# Patient Record
Sex: Female | Born: 1950 | Race: White | Hispanic: No | Marital: Married | Smoking: Never smoker
Health system: Southern US, Community
[De-identification: ages and names within clinical notes are randomized; demographics above are authoritative.]

## PROBLEM LIST (undated history)

## (undated) DIAGNOSIS — K219 Gastro-esophageal reflux disease without esophagitis: Secondary | ICD-10-CM

## (undated) DIAGNOSIS — F329 Major depressive disorder, single episode, unspecified: Secondary | ICD-10-CM

## (undated) DIAGNOSIS — M109 Gout, unspecified: Secondary | ICD-10-CM

## (undated) DIAGNOSIS — N289 Disorder of kidney and ureter, unspecified: Secondary | ICD-10-CM

## (undated) DIAGNOSIS — E119 Type 2 diabetes mellitus without complications: Secondary | ICD-10-CM

## (undated) DIAGNOSIS — I639 Cerebral infarction, unspecified: Secondary | ICD-10-CM

## (undated) DIAGNOSIS — Z8673 Personal history of transient ischemic attack (TIA), and cerebral infarction without residual deficits: Secondary | ICD-10-CM

## (undated) DIAGNOSIS — G2581 Restless legs syndrome: Secondary | ICD-10-CM

## (undated) DIAGNOSIS — K760 Fatty (change of) liver, not elsewhere classified: Secondary | ICD-10-CM

## (undated) DIAGNOSIS — I252 Old myocardial infarction: Secondary | ICD-10-CM

## (undated) DIAGNOSIS — K859 Acute pancreatitis without necrosis or infection, unspecified: Secondary | ICD-10-CM

## (undated) DIAGNOSIS — IMO0002 Reserved for concepts with insufficient information to code with codable children: Secondary | ICD-10-CM

## (undated) HISTORY — DX: Acute pancreatitis without necrosis or infection, unspecified: K85.90

## (undated) HISTORY — DX: Restless legs syndrome: G25.81

## (undated) HISTORY — DX: Type 2 diabetes mellitus without complications: E11.9

## (undated) HISTORY — DX: Gout, unspecified: M10.9

## (undated) HISTORY — DX: Fatty (change of) liver, not elsewhere classified: K76.0

## (undated) HISTORY — DX: Personal history of transient ischemic attack (TIA), and cerebral infarction without residual deficits: Z86.73

## (undated) HISTORY — DX: Cerebral infarction, unspecified: I63.9

## (undated) HISTORY — DX: Reserved for concepts with insufficient information to code with codable children: IMO0002

## (undated) HISTORY — DX: Old myocardial infarction: I25.2

## (undated) HISTORY — DX: Gastro-esophageal reflux disease without esophagitis: K21.9

## (undated) HISTORY — DX: Major depressive disorder, single episode, unspecified: F32.9

---

## 2016-07-30 DIAGNOSIS — E118 Type 2 diabetes mellitus with unspecified complications: Secondary | ICD-10-CM

## 2016-07-30 DIAGNOSIS — I959 Hypotension, unspecified: Secondary | ICD-10-CM

## 2016-07-30 DIAGNOSIS — N179 Acute kidney failure, unspecified: Secondary | ICD-10-CM

## 2016-07-30 DIAGNOSIS — K859 Acute pancreatitis without necrosis or infection, unspecified: Secondary | ICD-10-CM

## 2016-07-30 DIAGNOSIS — K219 Gastro-esophageal reflux disease without esophagitis: Secondary | ICD-10-CM

## 2016-07-30 DIAGNOSIS — G2581 Restless legs syndrome: Secondary | ICD-10-CM

## 2016-07-30 DIAGNOSIS — E781 Pure hyperglyceridemia: Secondary | ICD-10-CM

## 2016-07-30 DIAGNOSIS — E1142 Type 2 diabetes mellitus with diabetic polyneuropathy: Secondary | ICD-10-CM

## 2016-07-30 DIAGNOSIS — I10 Essential (primary) hypertension: Secondary | ICD-10-CM

## 2016-07-31 DIAGNOSIS — E118 Type 2 diabetes mellitus with unspecified complications: Secondary | ICD-10-CM | POA: Diagnosis not present

## 2016-07-31 DIAGNOSIS — I959 Hypotension, unspecified: Secondary | ICD-10-CM | POA: Diagnosis not present

## 2016-07-31 DIAGNOSIS — E1142 Type 2 diabetes mellitus with diabetic polyneuropathy: Secondary | ICD-10-CM | POA: Diagnosis not present

## 2016-07-31 DIAGNOSIS — I10 Essential (primary) hypertension: Secondary | ICD-10-CM | POA: Diagnosis not present

## 2016-08-01 DIAGNOSIS — I10 Essential (primary) hypertension: Secondary | ICD-10-CM | POA: Diagnosis not present

## 2016-08-01 DIAGNOSIS — E118 Type 2 diabetes mellitus with unspecified complications: Secondary | ICD-10-CM | POA: Diagnosis not present

## 2016-08-01 DIAGNOSIS — E1142 Type 2 diabetes mellitus with diabetic polyneuropathy: Secondary | ICD-10-CM | POA: Diagnosis not present

## 2016-08-01 DIAGNOSIS — I959 Hypotension, unspecified: Secondary | ICD-10-CM | POA: Diagnosis not present

## 2016-08-02 DIAGNOSIS — E1142 Type 2 diabetes mellitus with diabetic polyneuropathy: Secondary | ICD-10-CM | POA: Diagnosis not present

## 2016-08-02 DIAGNOSIS — I959 Hypotension, unspecified: Secondary | ICD-10-CM | POA: Diagnosis not present

## 2016-08-02 DIAGNOSIS — I10 Essential (primary) hypertension: Secondary | ICD-10-CM | POA: Diagnosis not present

## 2016-08-02 DIAGNOSIS — E118 Type 2 diabetes mellitus with unspecified complications: Secondary | ICD-10-CM | POA: Diagnosis not present

## 2016-09-13 DIAGNOSIS — K219 Gastro-esophageal reflux disease without esophagitis: Secondary | ICD-10-CM | POA: Diagnosis not present

## 2016-09-13 DIAGNOSIS — E118 Type 2 diabetes mellitus with unspecified complications: Secondary | ICD-10-CM | POA: Diagnosis not present

## 2016-09-13 DIAGNOSIS — K859 Acute pancreatitis without necrosis or infection, unspecified: Secondary | ICD-10-CM | POA: Diagnosis not present

## 2016-09-13 DIAGNOSIS — I10 Essential (primary) hypertension: Secondary | ICD-10-CM

## 2016-09-13 DIAGNOSIS — R112 Nausea with vomiting, unspecified: Secondary | ICD-10-CM

## 2016-09-14 DIAGNOSIS — K859 Acute pancreatitis without necrosis or infection, unspecified: Secondary | ICD-10-CM | POA: Diagnosis not present

## 2016-09-14 DIAGNOSIS — E1142 Type 2 diabetes mellitus with diabetic polyneuropathy: Secondary | ICD-10-CM

## 2016-09-14 DIAGNOSIS — I10 Essential (primary) hypertension: Secondary | ICD-10-CM

## 2016-09-14 DIAGNOSIS — E118 Type 2 diabetes mellitus with unspecified complications: Secondary | ICD-10-CM | POA: Diagnosis not present

## 2016-09-14 DIAGNOSIS — E781 Pure hyperglyceridemia: Secondary | ICD-10-CM

## 2016-09-15 DIAGNOSIS — K859 Acute pancreatitis without necrosis or infection, unspecified: Secondary | ICD-10-CM | POA: Diagnosis not present

## 2016-09-15 DIAGNOSIS — I10 Essential (primary) hypertension: Secondary | ICD-10-CM | POA: Diagnosis not present

## 2016-09-15 DIAGNOSIS — E118 Type 2 diabetes mellitus with unspecified complications: Secondary | ICD-10-CM | POA: Diagnosis not present

## 2016-09-15 DIAGNOSIS — E1142 Type 2 diabetes mellitus with diabetic polyneuropathy: Secondary | ICD-10-CM | POA: Diagnosis not present

## 2017-02-28 DIAGNOSIS — E1142 Type 2 diabetes mellitus with diabetic polyneuropathy: Secondary | ICD-10-CM | POA: Diagnosis not present

## 2017-02-28 DIAGNOSIS — E118 Type 2 diabetes mellitus with unspecified complications: Secondary | ICD-10-CM | POA: Diagnosis not present

## 2017-02-28 DIAGNOSIS — R112 Nausea with vomiting, unspecified: Secondary | ICD-10-CM

## 2017-02-28 DIAGNOSIS — F329 Major depressive disorder, single episode, unspecified: Secondary | ICD-10-CM | POA: Diagnosis not present

## 2017-02-28 DIAGNOSIS — K859 Acute pancreatitis without necrosis or infection, unspecified: Secondary | ICD-10-CM | POA: Diagnosis not present

## 2017-02-28 DIAGNOSIS — I160 Hypertensive urgency: Secondary | ICD-10-CM | POA: Diagnosis not present

## 2017-02-28 DIAGNOSIS — Z8719 Personal history of other diseases of the digestive system: Secondary | ICD-10-CM

## 2017-02-28 DIAGNOSIS — G2581 Restless legs syndrome: Secondary | ICD-10-CM | POA: Diagnosis not present

## 2017-02-28 DIAGNOSIS — E781 Pure hyperglyceridemia: Secondary | ICD-10-CM | POA: Diagnosis not present

## 2017-02-28 DIAGNOSIS — K219 Gastro-esophageal reflux disease without esophagitis: Secondary | ICD-10-CM | POA: Diagnosis not present

## 2017-03-01 DIAGNOSIS — E118 Type 2 diabetes mellitus with unspecified complications: Secondary | ICD-10-CM | POA: Diagnosis not present

## 2017-03-01 DIAGNOSIS — K859 Acute pancreatitis without necrosis or infection, unspecified: Secondary | ICD-10-CM | POA: Diagnosis not present

## 2017-03-01 DIAGNOSIS — E781 Pure hyperglyceridemia: Secondary | ICD-10-CM | POA: Diagnosis not present

## 2017-03-02 DIAGNOSIS — K859 Acute pancreatitis without necrosis or infection, unspecified: Secondary | ICD-10-CM | POA: Diagnosis not present

## 2017-03-02 DIAGNOSIS — E118 Type 2 diabetes mellitus with unspecified complications: Secondary | ICD-10-CM | POA: Diagnosis not present

## 2017-03-02 DIAGNOSIS — I160 Hypertensive urgency: Secondary | ICD-10-CM | POA: Diagnosis not present

## 2017-03-02 DIAGNOSIS — E781 Pure hyperglyceridemia: Secondary | ICD-10-CM | POA: Diagnosis not present

## 2017-03-03 DIAGNOSIS — K859 Acute pancreatitis without necrosis or infection, unspecified: Secondary | ICD-10-CM | POA: Diagnosis not present

## 2017-03-03 DIAGNOSIS — E781 Pure hyperglyceridemia: Secondary | ICD-10-CM | POA: Diagnosis not present

## 2017-03-03 DIAGNOSIS — I160 Hypertensive urgency: Secondary | ICD-10-CM | POA: Diagnosis not present

## 2017-03-03 DIAGNOSIS — E118 Type 2 diabetes mellitus with unspecified complications: Secondary | ICD-10-CM | POA: Diagnosis not present

## 2017-03-04 DIAGNOSIS — E118 Type 2 diabetes mellitus with unspecified complications: Secondary | ICD-10-CM | POA: Diagnosis not present

## 2017-03-04 DIAGNOSIS — K859 Acute pancreatitis without necrosis or infection, unspecified: Secondary | ICD-10-CM | POA: Diagnosis not present

## 2017-03-04 DIAGNOSIS — I160 Hypertensive urgency: Secondary | ICD-10-CM | POA: Diagnosis not present

## 2017-03-04 DIAGNOSIS — E781 Pure hyperglyceridemia: Secondary | ICD-10-CM | POA: Diagnosis not present

## 2017-03-05 DIAGNOSIS — K859 Acute pancreatitis without necrosis or infection, unspecified: Secondary | ICD-10-CM | POA: Diagnosis not present

## 2017-03-05 DIAGNOSIS — E781 Pure hyperglyceridemia: Secondary | ICD-10-CM | POA: Diagnosis not present

## 2017-03-05 DIAGNOSIS — E118 Type 2 diabetes mellitus with unspecified complications: Secondary | ICD-10-CM | POA: Diagnosis not present

## 2017-03-05 DIAGNOSIS — I160 Hypertensive urgency: Secondary | ICD-10-CM | POA: Diagnosis not present

## 2017-05-15 DIAGNOSIS — R079 Chest pain, unspecified: Secondary | ICD-10-CM | POA: Diagnosis not present

## 2017-06-25 DIAGNOSIS — E781 Pure hyperglyceridemia: Secondary | ICD-10-CM

## 2017-06-25 DIAGNOSIS — I10 Essential (primary) hypertension: Secondary | ICD-10-CM | POA: Diagnosis not present

## 2017-06-25 DIAGNOSIS — F329 Major depressive disorder, single episode, unspecified: Secondary | ICD-10-CM | POA: Diagnosis not present

## 2017-06-25 DIAGNOSIS — E1142 Type 2 diabetes mellitus with diabetic polyneuropathy: Secondary | ICD-10-CM

## 2017-06-25 DIAGNOSIS — R112 Nausea with vomiting, unspecified: Secondary | ICD-10-CM | POA: Diagnosis not present

## 2017-06-25 DIAGNOSIS — K219 Gastro-esophageal reflux disease without esophagitis: Secondary | ICD-10-CM | POA: Diagnosis not present

## 2017-06-25 DIAGNOSIS — K859 Acute pancreatitis without necrosis or infection, unspecified: Secondary | ICD-10-CM

## 2017-06-25 DIAGNOSIS — E118 Type 2 diabetes mellitus with unspecified complications: Secondary | ICD-10-CM

## 2017-06-26 DIAGNOSIS — K219 Gastro-esophageal reflux disease without esophagitis: Secondary | ICD-10-CM | POA: Diagnosis not present

## 2017-06-26 DIAGNOSIS — E1142 Type 2 diabetes mellitus with diabetic polyneuropathy: Secondary | ICD-10-CM | POA: Diagnosis not present

## 2017-06-26 DIAGNOSIS — F329 Major depressive disorder, single episode, unspecified: Secondary | ICD-10-CM | POA: Diagnosis not present

## 2017-06-26 DIAGNOSIS — R112 Nausea with vomiting, unspecified: Secondary | ICD-10-CM | POA: Diagnosis not present

## 2017-06-26 DIAGNOSIS — K859 Acute pancreatitis without necrosis or infection, unspecified: Secondary | ICD-10-CM | POA: Diagnosis not present

## 2017-06-26 DIAGNOSIS — E781 Pure hyperglyceridemia: Secondary | ICD-10-CM | POA: Diagnosis not present

## 2017-06-26 DIAGNOSIS — I10 Essential (primary) hypertension: Secondary | ICD-10-CM | POA: Diagnosis not present

## 2017-06-26 DIAGNOSIS — E118 Type 2 diabetes mellitus with unspecified complications: Secondary | ICD-10-CM | POA: Diagnosis not present

## 2017-06-27 DIAGNOSIS — F329 Major depressive disorder, single episode, unspecified: Secondary | ICD-10-CM | POA: Diagnosis not present

## 2017-06-27 DIAGNOSIS — R112 Nausea with vomiting, unspecified: Secondary | ICD-10-CM | POA: Diagnosis not present

## 2017-06-27 DIAGNOSIS — K859 Acute pancreatitis without necrosis or infection, unspecified: Secondary | ICD-10-CM | POA: Diagnosis not present

## 2017-06-27 DIAGNOSIS — I10 Essential (primary) hypertension: Secondary | ICD-10-CM | POA: Diagnosis not present

## 2017-06-27 DIAGNOSIS — E118 Type 2 diabetes mellitus with unspecified complications: Secondary | ICD-10-CM | POA: Diagnosis not present

## 2017-06-27 DIAGNOSIS — K219 Gastro-esophageal reflux disease without esophagitis: Secondary | ICD-10-CM | POA: Diagnosis not present

## 2017-06-27 DIAGNOSIS — E1142 Type 2 diabetes mellitus with diabetic polyneuropathy: Secondary | ICD-10-CM | POA: Diagnosis not present

## 2017-06-27 DIAGNOSIS — E781 Pure hyperglyceridemia: Secondary | ICD-10-CM | POA: Diagnosis not present

## 2017-06-28 DIAGNOSIS — K219 Gastro-esophageal reflux disease without esophagitis: Secondary | ICD-10-CM | POA: Diagnosis not present

## 2017-06-28 DIAGNOSIS — K859 Acute pancreatitis without necrosis or infection, unspecified: Secondary | ICD-10-CM | POA: Diagnosis not present

## 2017-06-28 DIAGNOSIS — N179 Acute kidney failure, unspecified: Secondary | ICD-10-CM

## 2017-06-28 DIAGNOSIS — R109 Unspecified abdominal pain: Secondary | ICD-10-CM

## 2017-06-28 DIAGNOSIS — E118 Type 2 diabetes mellitus with unspecified complications: Secondary | ICD-10-CM | POA: Diagnosis not present

## 2017-06-29 DIAGNOSIS — N179 Acute kidney failure, unspecified: Secondary | ICD-10-CM | POA: Diagnosis not present

## 2017-06-29 DIAGNOSIS — K219 Gastro-esophageal reflux disease without esophagitis: Secondary | ICD-10-CM | POA: Diagnosis not present

## 2017-06-29 DIAGNOSIS — R109 Unspecified abdominal pain: Secondary | ICD-10-CM | POA: Diagnosis not present

## 2017-06-29 DIAGNOSIS — K859 Acute pancreatitis without necrosis or infection, unspecified: Secondary | ICD-10-CM | POA: Diagnosis not present

## 2017-06-29 DIAGNOSIS — E118 Type 2 diabetes mellitus with unspecified complications: Secondary | ICD-10-CM | POA: Diagnosis not present

## 2017-06-30 DIAGNOSIS — N179 Acute kidney failure, unspecified: Secondary | ICD-10-CM | POA: Diagnosis not present

## 2017-06-30 DIAGNOSIS — K219 Gastro-esophageal reflux disease without esophagitis: Secondary | ICD-10-CM | POA: Diagnosis not present

## 2017-06-30 DIAGNOSIS — K859 Acute pancreatitis without necrosis or infection, unspecified: Secondary | ICD-10-CM | POA: Diagnosis not present

## 2017-06-30 DIAGNOSIS — E118 Type 2 diabetes mellitus with unspecified complications: Secondary | ICD-10-CM | POA: Diagnosis not present

## 2017-06-30 DIAGNOSIS — R109 Unspecified abdominal pain: Secondary | ICD-10-CM | POA: Diagnosis not present

## 2017-07-01 DIAGNOSIS — K219 Gastro-esophageal reflux disease without esophagitis: Secondary | ICD-10-CM | POA: Diagnosis not present

## 2017-07-01 DIAGNOSIS — K859 Acute pancreatitis without necrosis or infection, unspecified: Secondary | ICD-10-CM | POA: Diagnosis not present

## 2017-07-01 DIAGNOSIS — R109 Unspecified abdominal pain: Secondary | ICD-10-CM | POA: Diagnosis not present

## 2017-07-01 DIAGNOSIS — E118 Type 2 diabetes mellitus with unspecified complications: Secondary | ICD-10-CM | POA: Diagnosis not present

## 2017-07-01 DIAGNOSIS — N179 Acute kidney failure, unspecified: Secondary | ICD-10-CM | POA: Diagnosis not present

## 2017-08-25 HISTORY — PX: CHOLECYSTECTOMY: SHX55

## 2017-09-29 DIAGNOSIS — I639 Cerebral infarction, unspecified: Secondary | ICD-10-CM | POA: Diagnosis not present

## 2017-09-30 DIAGNOSIS — I639 Cerebral infarction, unspecified: Secondary | ICD-10-CM | POA: Diagnosis not present

## 2018-04-21 DIAGNOSIS — E781 Pure hyperglyceridemia: Secondary | ICD-10-CM

## 2018-04-21 DIAGNOSIS — R079 Chest pain, unspecified: Secondary | ICD-10-CM | POA: Diagnosis not present

## 2018-04-21 DIAGNOSIS — I160 Hypertensive urgency: Secondary | ICD-10-CM | POA: Diagnosis not present

## 2018-04-21 DIAGNOSIS — M109 Gout, unspecified: Secondary | ICD-10-CM

## 2018-04-21 DIAGNOSIS — K859 Acute pancreatitis without necrosis or infection, unspecified: Secondary | ICD-10-CM | POA: Diagnosis not present

## 2018-04-21 DIAGNOSIS — G8929 Other chronic pain: Secondary | ICD-10-CM

## 2018-04-21 DIAGNOSIS — G2581 Restless legs syndrome: Secondary | ICD-10-CM

## 2018-04-21 DIAGNOSIS — E1110 Type 2 diabetes mellitus with ketoacidosis without coma: Secondary | ICD-10-CM | POA: Diagnosis not present

## 2018-04-21 DIAGNOSIS — K219 Gastro-esophageal reflux disease without esophagitis: Secondary | ICD-10-CM

## 2018-04-21 DIAGNOSIS — E1142 Type 2 diabetes mellitus with diabetic polyneuropathy: Secondary | ICD-10-CM

## 2018-04-21 DIAGNOSIS — R112 Nausea with vomiting, unspecified: Secondary | ICD-10-CM

## 2018-04-22 DIAGNOSIS — E1110 Type 2 diabetes mellitus with ketoacidosis without coma: Secondary | ICD-10-CM | POA: Diagnosis not present

## 2018-04-22 DIAGNOSIS — E1142 Type 2 diabetes mellitus with diabetic polyneuropathy: Secondary | ICD-10-CM | POA: Diagnosis not present

## 2018-04-22 DIAGNOSIS — K859 Acute pancreatitis without necrosis or infection, unspecified: Secondary | ICD-10-CM | POA: Diagnosis not present

## 2018-04-22 DIAGNOSIS — E781 Pure hyperglyceridemia: Secondary | ICD-10-CM | POA: Diagnosis not present

## 2018-04-23 DIAGNOSIS — E1110 Type 2 diabetes mellitus with ketoacidosis without coma: Secondary | ICD-10-CM | POA: Diagnosis not present

## 2018-04-23 DIAGNOSIS — E1142 Type 2 diabetes mellitus with diabetic polyneuropathy: Secondary | ICD-10-CM | POA: Diagnosis not present

## 2018-04-23 DIAGNOSIS — K859 Acute pancreatitis without necrosis or infection, unspecified: Secondary | ICD-10-CM | POA: Diagnosis not present

## 2018-04-23 DIAGNOSIS — E781 Pure hyperglyceridemia: Secondary | ICD-10-CM | POA: Diagnosis not present

## 2018-04-24 DIAGNOSIS — K859 Acute pancreatitis without necrosis or infection, unspecified: Secondary | ICD-10-CM | POA: Diagnosis not present

## 2018-04-24 DIAGNOSIS — E1142 Type 2 diabetes mellitus with diabetic polyneuropathy: Secondary | ICD-10-CM | POA: Diagnosis not present

## 2018-04-24 DIAGNOSIS — E781 Pure hyperglyceridemia: Secondary | ICD-10-CM | POA: Diagnosis not present

## 2018-04-24 DIAGNOSIS — E1110 Type 2 diabetes mellitus with ketoacidosis without coma: Secondary | ICD-10-CM | POA: Diagnosis not present

## 2018-04-25 DIAGNOSIS — E1110 Type 2 diabetes mellitus with ketoacidosis without coma: Secondary | ICD-10-CM | POA: Diagnosis not present

## 2018-04-25 DIAGNOSIS — M109 Gout, unspecified: Secondary | ICD-10-CM | POA: Diagnosis not present

## 2018-04-25 DIAGNOSIS — K859 Acute pancreatitis without necrosis or infection, unspecified: Secondary | ICD-10-CM | POA: Diagnosis not present

## 2018-04-25 DIAGNOSIS — E1142 Type 2 diabetes mellitus with diabetic polyneuropathy: Secondary | ICD-10-CM | POA: Diagnosis not present

## 2018-04-26 DIAGNOSIS — E1142 Type 2 diabetes mellitus with diabetic polyneuropathy: Secondary | ICD-10-CM | POA: Diagnosis not present

## 2018-04-26 DIAGNOSIS — M109 Gout, unspecified: Secondary | ICD-10-CM | POA: Diagnosis not present

## 2018-04-26 DIAGNOSIS — K859 Acute pancreatitis without necrosis or infection, unspecified: Secondary | ICD-10-CM | POA: Diagnosis not present

## 2018-04-26 DIAGNOSIS — E1110 Type 2 diabetes mellitus with ketoacidosis without coma: Secondary | ICD-10-CM | POA: Diagnosis not present

## 2018-08-23 ENCOUNTER — Other Ambulatory Visit: Payer: Self-pay | Admitting: Family Medicine

## 2018-08-23 DIAGNOSIS — N6489 Other specified disorders of breast: Secondary | ICD-10-CM

## 2018-08-23 DIAGNOSIS — N63 Unspecified lump in unspecified breast: Secondary | ICD-10-CM

## 2018-08-26 DIAGNOSIS — E1165 Type 2 diabetes mellitus with hyperglycemia: Secondary | ICD-10-CM

## 2018-08-26 DIAGNOSIS — K859 Acute pancreatitis without necrosis or infection, unspecified: Secondary | ICD-10-CM | POA: Diagnosis not present

## 2018-08-26 DIAGNOSIS — I10 Essential (primary) hypertension: Secondary | ICD-10-CM

## 2018-08-27 DIAGNOSIS — K859 Acute pancreatitis without necrosis or infection, unspecified: Secondary | ICD-10-CM | POA: Diagnosis not present

## 2018-08-27 DIAGNOSIS — I10 Essential (primary) hypertension: Secondary | ICD-10-CM | POA: Diagnosis not present

## 2018-08-27 DIAGNOSIS — E1165 Type 2 diabetes mellitus with hyperglycemia: Secondary | ICD-10-CM | POA: Diagnosis not present

## 2018-08-28 DIAGNOSIS — K859 Acute pancreatitis without necrosis or infection, unspecified: Secondary | ICD-10-CM | POA: Diagnosis not present

## 2018-08-28 DIAGNOSIS — E1165 Type 2 diabetes mellitus with hyperglycemia: Secondary | ICD-10-CM | POA: Diagnosis not present

## 2018-08-28 DIAGNOSIS — I10 Essential (primary) hypertension: Secondary | ICD-10-CM | POA: Diagnosis not present

## 2018-08-29 DIAGNOSIS — I10 Essential (primary) hypertension: Secondary | ICD-10-CM | POA: Diagnosis not present

## 2018-08-29 DIAGNOSIS — K859 Acute pancreatitis without necrosis or infection, unspecified: Secondary | ICD-10-CM | POA: Diagnosis not present

## 2018-08-29 DIAGNOSIS — E1165 Type 2 diabetes mellitus with hyperglycemia: Secondary | ICD-10-CM | POA: Diagnosis not present

## 2018-08-31 ENCOUNTER — Ambulatory Visit
Admission: RE | Admit: 2018-08-31 | Discharge: 2018-08-31 | Disposition: A | Discharge status: Home / Self Care | Payer: Medicare Other | Source: Ambulatory Visit | Attending: Family Medicine | Admitting: Family Medicine

## 2018-08-31 DIAGNOSIS — N6489 Other specified disorders of breast: Secondary | ICD-10-CM

## 2018-08-31 DIAGNOSIS — N63 Unspecified lump in unspecified breast: Secondary | ICD-10-CM

## 2018-10-07 DIAGNOSIS — G2581 Restless legs syndrome: Secondary | ICD-10-CM

## 2018-10-07 DIAGNOSIS — R109 Unspecified abdominal pain: Secondary | ICD-10-CM | POA: Diagnosis not present

## 2018-10-07 DIAGNOSIS — K859 Acute pancreatitis without necrosis or infection, unspecified: Secondary | ICD-10-CM

## 2018-10-07 DIAGNOSIS — I160 Hypertensive urgency: Secondary | ICD-10-CM | POA: Diagnosis not present

## 2018-10-07 DIAGNOSIS — E1165 Type 2 diabetes mellitus with hyperglycemia: Secondary | ICD-10-CM

## 2018-10-07 DIAGNOSIS — M109 Gout, unspecified: Secondary | ICD-10-CM

## 2018-10-07 DIAGNOSIS — K219 Gastro-esophageal reflux disease without esophagitis: Secondary | ICD-10-CM

## 2018-10-08 DIAGNOSIS — R109 Unspecified abdominal pain: Secondary | ICD-10-CM | POA: Diagnosis not present

## 2018-10-08 DIAGNOSIS — K859 Acute pancreatitis without necrosis or infection, unspecified: Secondary | ICD-10-CM | POA: Diagnosis not present

## 2018-10-08 DIAGNOSIS — I160 Hypertensive urgency: Secondary | ICD-10-CM | POA: Diagnosis not present

## 2018-10-08 DIAGNOSIS — E1165 Type 2 diabetes mellitus with hyperglycemia: Secondary | ICD-10-CM | POA: Diagnosis not present

## 2018-10-09 DIAGNOSIS — I160 Hypertensive urgency: Secondary | ICD-10-CM | POA: Diagnosis not present

## 2018-10-09 DIAGNOSIS — R109 Unspecified abdominal pain: Secondary | ICD-10-CM | POA: Diagnosis not present

## 2018-10-09 DIAGNOSIS — K859 Acute pancreatitis without necrosis or infection, unspecified: Secondary | ICD-10-CM | POA: Diagnosis not present

## 2018-10-09 DIAGNOSIS — E1165 Type 2 diabetes mellitus with hyperglycemia: Secondary | ICD-10-CM | POA: Diagnosis not present

## 2018-10-10 DIAGNOSIS — E1165 Type 2 diabetes mellitus with hyperglycemia: Secondary | ICD-10-CM | POA: Diagnosis not present

## 2018-10-10 DIAGNOSIS — R109 Unspecified abdominal pain: Secondary | ICD-10-CM | POA: Diagnosis not present

## 2018-10-10 DIAGNOSIS — K859 Acute pancreatitis without necrosis or infection, unspecified: Secondary | ICD-10-CM | POA: Diagnosis not present

## 2018-10-10 DIAGNOSIS — I160 Hypertensive urgency: Secondary | ICD-10-CM | POA: Diagnosis not present

## 2018-10-11 DIAGNOSIS — I160 Hypertensive urgency: Secondary | ICD-10-CM | POA: Diagnosis not present

## 2018-10-11 DIAGNOSIS — K859 Acute pancreatitis without necrosis or infection, unspecified: Secondary | ICD-10-CM | POA: Diagnosis not present

## 2018-10-11 DIAGNOSIS — R109 Unspecified abdominal pain: Secondary | ICD-10-CM | POA: Diagnosis not present

## 2018-10-11 DIAGNOSIS — E1165 Type 2 diabetes mellitus with hyperglycemia: Secondary | ICD-10-CM | POA: Diagnosis not present

## 2019-03-31 DIAGNOSIS — N390 Urinary tract infection, site not specified: Secondary | ICD-10-CM | POA: Diagnosis not present

## 2019-03-31 DIAGNOSIS — K859 Acute pancreatitis without necrosis or infection, unspecified: Secondary | ICD-10-CM | POA: Diagnosis not present

## 2019-03-31 DIAGNOSIS — E781 Pure hyperglyceridemia: Secondary | ICD-10-CM

## 2019-03-31 DIAGNOSIS — K219 Gastro-esophageal reflux disease without esophagitis: Secondary | ICD-10-CM

## 2019-03-31 DIAGNOSIS — K921 Melena: Secondary | ICD-10-CM | POA: Diagnosis not present

## 2019-03-31 DIAGNOSIS — K861 Other chronic pancreatitis: Secondary | ICD-10-CM

## 2019-03-31 DIAGNOSIS — B9620 Unspecified Escherichia coli [E. coli] as the cause of diseases classified elsewhere: Secondary | ICD-10-CM

## 2019-03-31 DIAGNOSIS — R1084 Generalized abdominal pain: Secondary | ICD-10-CM | POA: Diagnosis not present

## 2019-03-31 DIAGNOSIS — E1165 Type 2 diabetes mellitus with hyperglycemia: Secondary | ICD-10-CM

## 2019-03-31 DIAGNOSIS — K274 Chronic or unspecified peptic ulcer, site unspecified, with hemorrhage: Secondary | ICD-10-CM | POA: Diagnosis not present

## 2019-04-01 DIAGNOSIS — K274 Chronic or unspecified peptic ulcer, site unspecified, with hemorrhage: Secondary | ICD-10-CM | POA: Diagnosis not present

## 2019-04-01 DIAGNOSIS — K921 Melena: Secondary | ICD-10-CM | POA: Diagnosis not present

## 2019-04-01 DIAGNOSIS — N390 Urinary tract infection, site not specified: Secondary | ICD-10-CM | POA: Diagnosis not present

## 2019-04-01 DIAGNOSIS — K859 Acute pancreatitis without necrosis or infection, unspecified: Secondary | ICD-10-CM | POA: Diagnosis not present

## 2019-04-02 DIAGNOSIS — K921 Melena: Secondary | ICD-10-CM | POA: Diagnosis not present

## 2019-04-02 DIAGNOSIS — K274 Chronic or unspecified peptic ulcer, site unspecified, with hemorrhage: Secondary | ICD-10-CM | POA: Diagnosis not present

## 2019-04-02 DIAGNOSIS — N390 Urinary tract infection, site not specified: Secondary | ICD-10-CM | POA: Diagnosis not present

## 2019-04-02 DIAGNOSIS — K859 Acute pancreatitis without necrosis or infection, unspecified: Secondary | ICD-10-CM | POA: Diagnosis not present

## 2019-04-03 DIAGNOSIS — N390 Urinary tract infection, site not specified: Secondary | ICD-10-CM | POA: Diagnosis not present

## 2019-04-03 DIAGNOSIS — K859 Acute pancreatitis without necrosis or infection, unspecified: Secondary | ICD-10-CM | POA: Diagnosis not present

## 2019-04-03 DIAGNOSIS — K274 Chronic or unspecified peptic ulcer, site unspecified, with hemorrhage: Secondary | ICD-10-CM | POA: Diagnosis not present

## 2019-04-03 DIAGNOSIS — K921 Melena: Secondary | ICD-10-CM | POA: Diagnosis not present

## 2019-04-04 DIAGNOSIS — N390 Urinary tract infection, site not specified: Secondary | ICD-10-CM | POA: Diagnosis not present

## 2019-04-04 DIAGNOSIS — K921 Melena: Secondary | ICD-10-CM | POA: Diagnosis not present

## 2019-04-04 DIAGNOSIS — K859 Acute pancreatitis without necrosis or infection, unspecified: Secondary | ICD-10-CM | POA: Diagnosis not present

## 2019-04-04 DIAGNOSIS — K274 Chronic or unspecified peptic ulcer, site unspecified, with hemorrhage: Secondary | ICD-10-CM | POA: Diagnosis not present

## 2019-04-05 DIAGNOSIS — K274 Chronic or unspecified peptic ulcer, site unspecified, with hemorrhage: Secondary | ICD-10-CM | POA: Diagnosis not present

## 2019-04-05 DIAGNOSIS — K921 Melena: Secondary | ICD-10-CM | POA: Diagnosis not present

## 2019-04-05 DIAGNOSIS — N390 Urinary tract infection, site not specified: Secondary | ICD-10-CM | POA: Diagnosis not present

## 2019-04-05 DIAGNOSIS — K859 Acute pancreatitis without necrosis or infection, unspecified: Secondary | ICD-10-CM | POA: Diagnosis not present

## 2019-04-10 IMAGING — US US BREAST BX W LOC DEV 1ST LESION IMG BX SPEC US GUIDE*L*
1 series · 8 of 8 positions shown · non-contrast
Comparison: Previous exam(s).

Addendum:
CLINICAL DATA: 67-year-old female presenting for ultrasound-guided
biopsy of a left breast mass.

EXAM:
ULTRASOUND GUIDED LEFT BREAST CORE NEEDLE BIOPSY

[Series 1: us breast bx w loc dev 1st lesion img bx spec us g · 0.05mm/px · 8 of 8 slices shown]
[im 1/8]
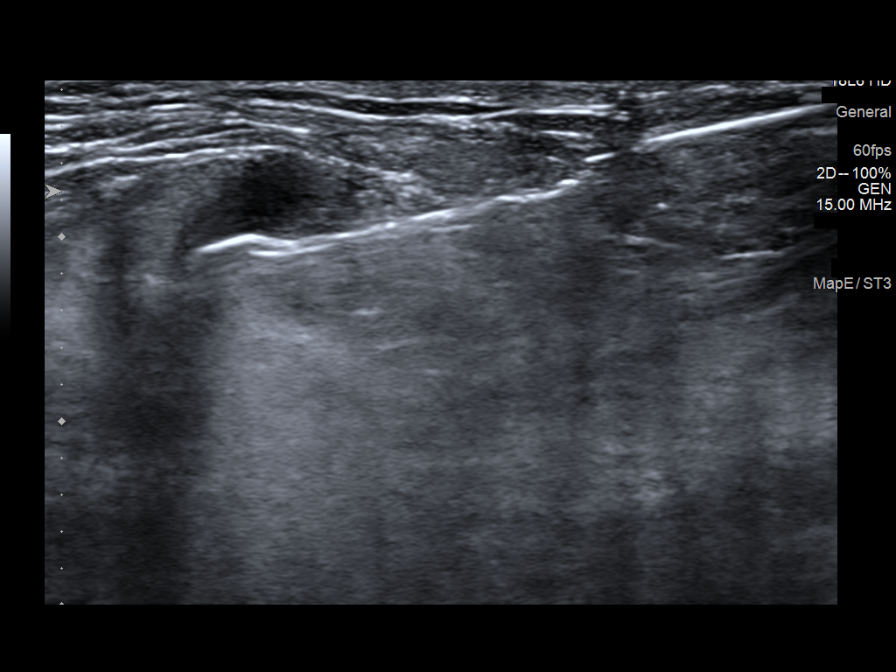
[im 2/8]
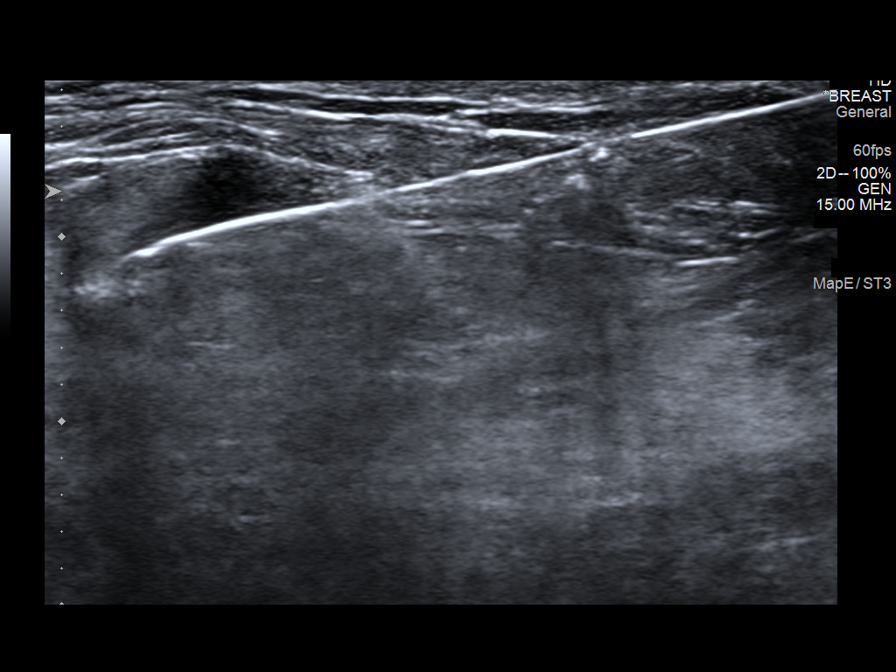
[im 3/8]
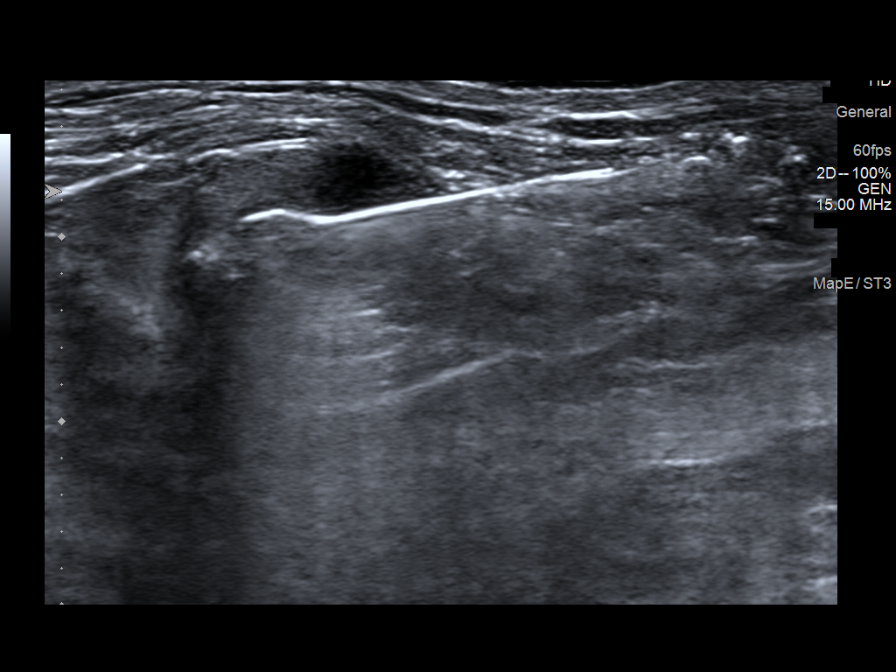
[im 4/8]
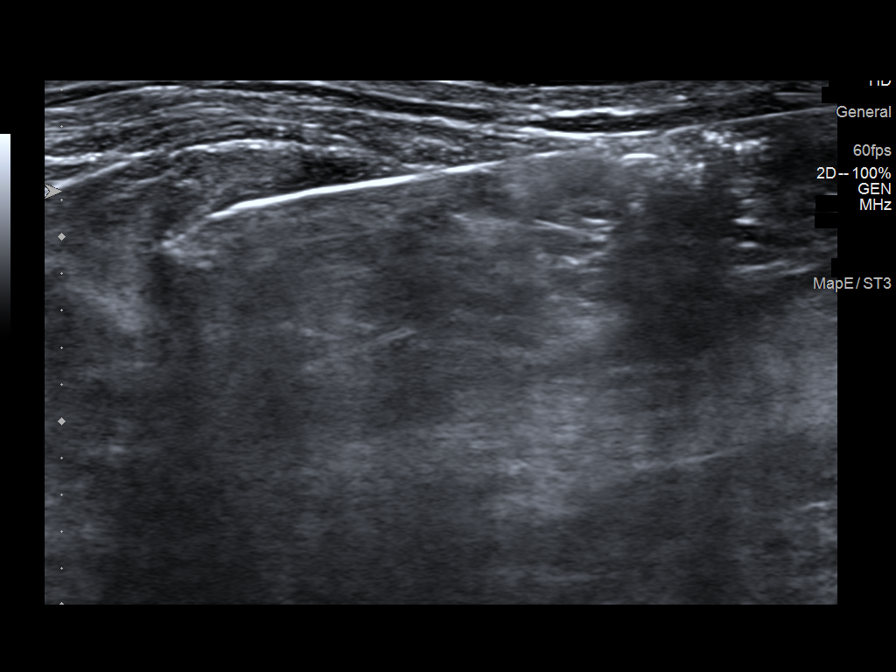
[im 5/8]
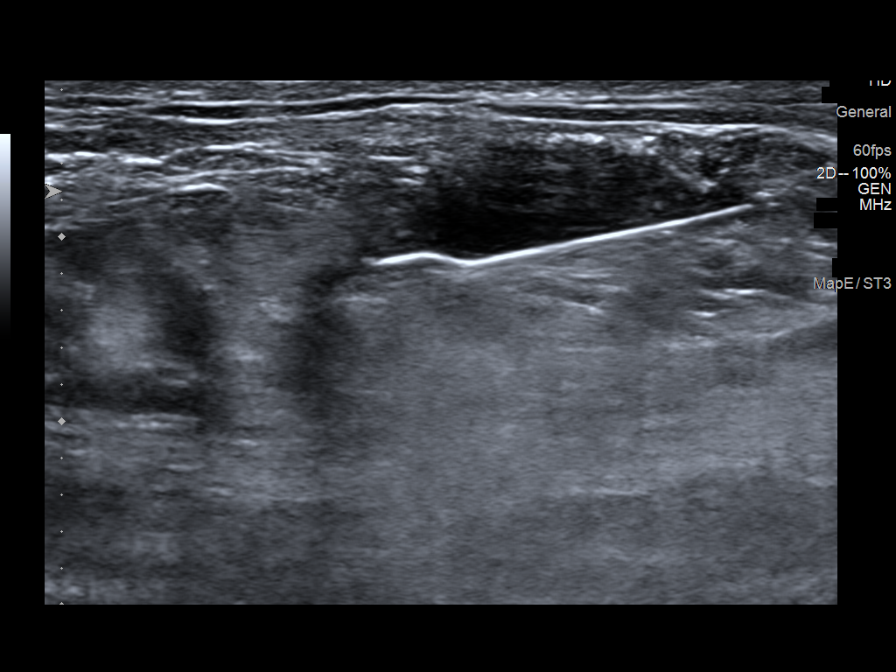
[im 6/8]
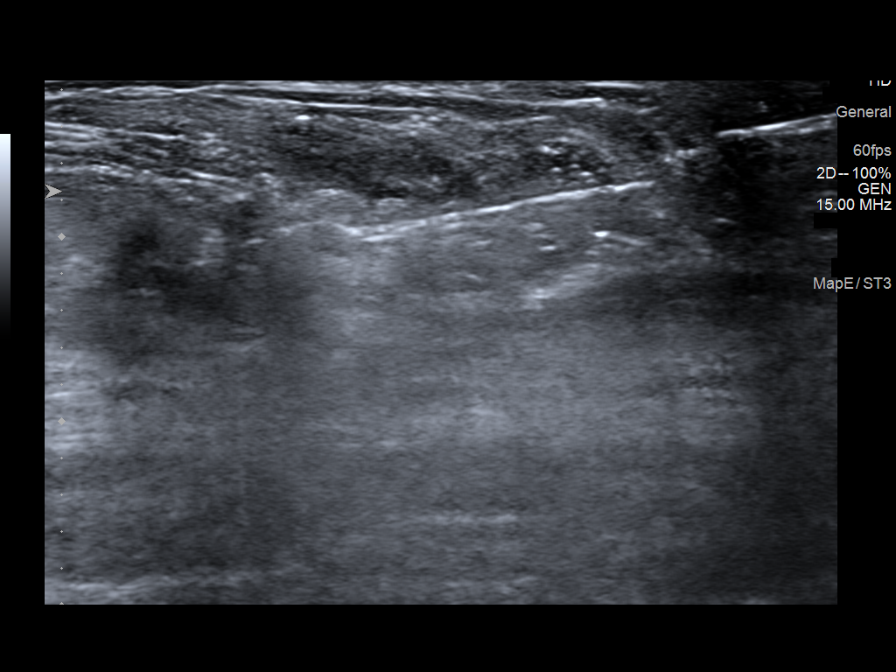
[im 7/8]
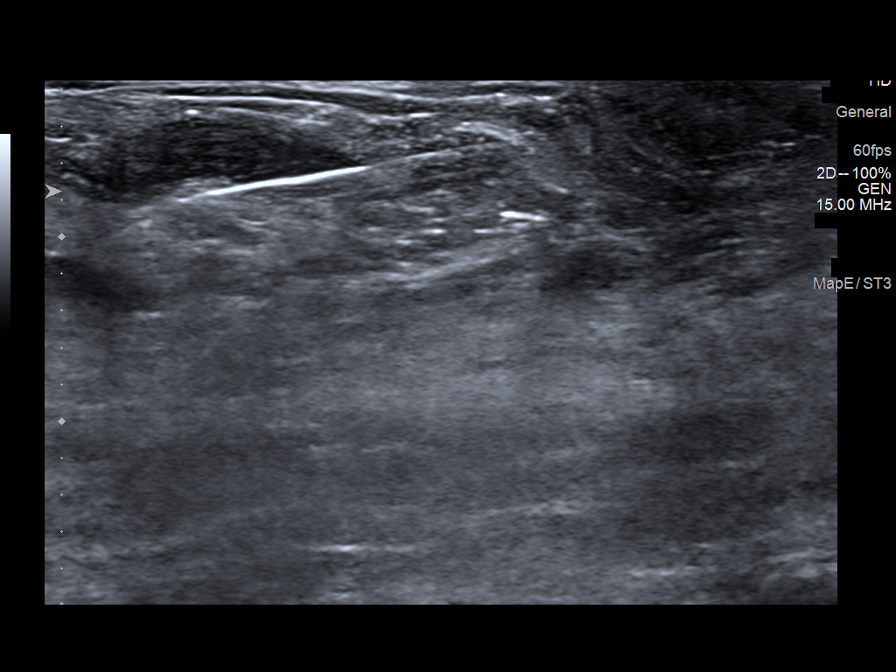
[im 8/8]
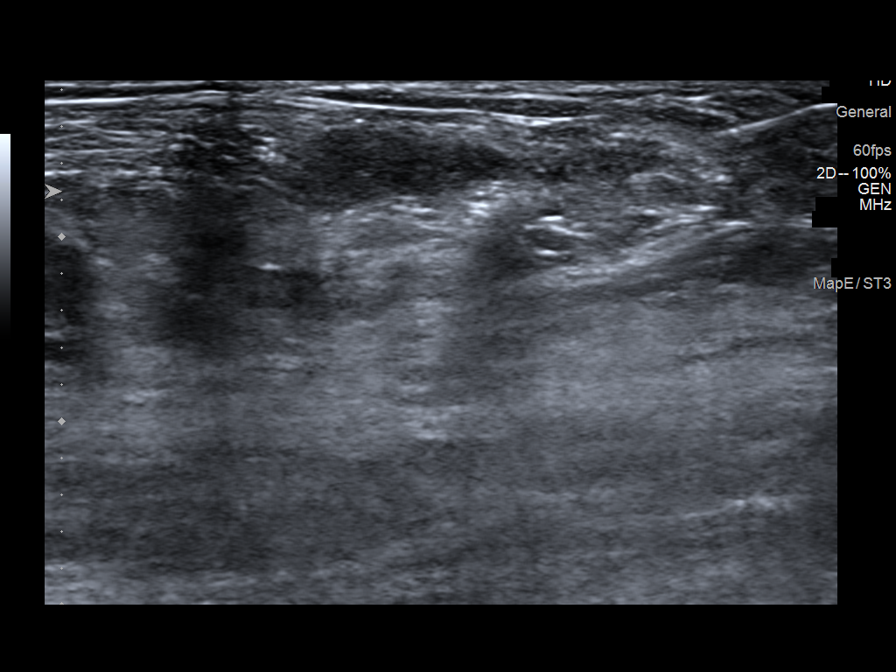

[8 of 8 positions shown; findings below may reference images not displayed]



Lesion quadrant: Upper-outer quadrant

Using sterile technique and 1% Lidocaine as local anesthetic, under
direct ultrasound visualization, a 14 gauge Morebodi device was
used to perform biopsy of a mass in the left breast at 1 o'clock
using an inferior approach. At the conclusion of the procedure a
ribbon shaped tissue marker clip was deployed into the biopsy
cavity. The patient did develop a small hematoma at the biopsy site.
Bleeding was controlled with manual pressure and stopped prior to
bandaging.

Follow up 2 view mammogram was performed and dictated separately.
IMPRESSION: 1. Ultrasound guided biopsy of left breast mass at 1 o'clock. The
patient developed a small hematoma at the biopsy site. Bleeding was
controlled before the patient was released.

2. A stereotactic biopsy was scheduled to be performed following
this ultrasound biopsy. Stereotactic biopsy was canceled as the area
of concern did not persist. This will be described in further detail
in an addendum to the original diagnostic mammogram report from
08/23/2018.

ADDENDUM:
Pathology revealed FIBROCYSTIC CHANGES WITH APOCRINE METAPLASIA of
the Left breast, 1 o'clock. This was found to be concordant by Dr.
Yau Kam Saludares.

Pathology results were discussed with the patient by telephone. The
patient reported doing well after the biopsy with tenderness at the
site. Post biopsy instructions and care were reviewed and questions
were answered. The patient was encouraged to call The [REDACTED]

The patient was asked to return for left diagnostic mammography and
possible ultrasound in 6 months to monitor an area of questioned
distortion in the upper outer left breast that did not persist at
the time of biopsy. The patient was informed a reminder notice would
be sent regarding this appointment.

Pathology results reported by Elouali Hasa, RN on 09/03/2018.

*** End of Addendum ***

## 2019-09-01 DIAGNOSIS — E781 Pure hyperglyceridemia: Secondary | ICD-10-CM | POA: Diagnosis not present

## 2019-09-01 DIAGNOSIS — K861 Other chronic pancreatitis: Secondary | ICD-10-CM

## 2019-09-01 DIAGNOSIS — N179 Acute kidney failure, unspecified: Secondary | ICD-10-CM | POA: Diagnosis not present

## 2019-09-01 DIAGNOSIS — E119 Type 2 diabetes mellitus without complications: Secondary | ICD-10-CM | POA: Diagnosis not present

## 2019-09-02 DIAGNOSIS — E781 Pure hyperglyceridemia: Secondary | ICD-10-CM | POA: Diagnosis not present

## 2019-09-02 DIAGNOSIS — N179 Acute kidney failure, unspecified: Secondary | ICD-10-CM | POA: Diagnosis not present

## 2019-09-02 DIAGNOSIS — E119 Type 2 diabetes mellitus without complications: Secondary | ICD-10-CM | POA: Diagnosis not present

## 2019-09-02 DIAGNOSIS — K861 Other chronic pancreatitis: Secondary | ICD-10-CM | POA: Diagnosis not present

## 2019-09-03 DIAGNOSIS — K861 Other chronic pancreatitis: Secondary | ICD-10-CM | POA: Diagnosis not present

## 2019-09-03 DIAGNOSIS — E781 Pure hyperglyceridemia: Secondary | ICD-10-CM | POA: Diagnosis not present

## 2019-09-03 DIAGNOSIS — E119 Type 2 diabetes mellitus without complications: Secondary | ICD-10-CM | POA: Diagnosis not present

## 2019-09-03 DIAGNOSIS — N179 Acute kidney failure, unspecified: Secondary | ICD-10-CM | POA: Diagnosis not present

## 2019-12-04 DIAGNOSIS — I340 Nonrheumatic mitral (valve) insufficiency: Secondary | ICD-10-CM | POA: Diagnosis not present

## 2019-12-11 ENCOUNTER — Ambulatory Visit: Payer: Medicare Other | Admitting: Gastroenterology

## 2020-01-13 ENCOUNTER — Encounter: Payer: Self-pay | Admitting: Family Medicine

## 2020-01-21 DIAGNOSIS — E119 Type 2 diabetes mellitus without complications: Secondary | ICD-10-CM | POA: Diagnosis not present

## 2020-01-21 DIAGNOSIS — R109 Unspecified abdominal pain: Secondary | ICD-10-CM

## 2020-01-22 DIAGNOSIS — E119 Type 2 diabetes mellitus without complications: Secondary | ICD-10-CM | POA: Diagnosis not present

## 2020-01-22 DIAGNOSIS — R109 Unspecified abdominal pain: Secondary | ICD-10-CM | POA: Diagnosis not present

## 2020-01-23 DIAGNOSIS — R109 Unspecified abdominal pain: Secondary | ICD-10-CM | POA: Diagnosis not present

## 2020-01-23 DIAGNOSIS — E119 Type 2 diabetes mellitus without complications: Secondary | ICD-10-CM | POA: Diagnosis not present

## 2020-01-24 DIAGNOSIS — E119 Type 2 diabetes mellitus without complications: Secondary | ICD-10-CM | POA: Diagnosis not present

## 2020-01-24 DIAGNOSIS — R109 Unspecified abdominal pain: Secondary | ICD-10-CM | POA: Diagnosis not present

## 2020-01-25 DIAGNOSIS — R109 Unspecified abdominal pain: Secondary | ICD-10-CM | POA: Diagnosis not present

## 2020-01-25 DIAGNOSIS — E119 Type 2 diabetes mellitus without complications: Secondary | ICD-10-CM | POA: Diagnosis not present

## 2020-01-26 DIAGNOSIS — E119 Type 2 diabetes mellitus without complications: Secondary | ICD-10-CM | POA: Diagnosis not present

## 2020-01-26 DIAGNOSIS — R109 Unspecified abdominal pain: Secondary | ICD-10-CM | POA: Diagnosis not present

## 2020-04-10 DIAGNOSIS — J1282 Pneumonia due to coronavirus disease 2019: Secondary | ICD-10-CM

## 2021-10-15 DIAGNOSIS — J90 Pleural effusion, not elsewhere classified: Secondary | ICD-10-CM | POA: Diagnosis not present

## 2021-10-15 DIAGNOSIS — I340 Nonrheumatic mitral (valve) insufficiency: Secondary | ICD-10-CM | POA: Diagnosis not present

## 2022-12-17 ENCOUNTER — Encounter (HOSPITAL_COMMUNITY): Payer: Self-pay

## 2022-12-17 ENCOUNTER — Emergency Department (HOSPITAL_COMMUNITY): Payer: Medicare Other

## 2022-12-17 ENCOUNTER — Other Ambulatory Visit: Payer: Self-pay

## 2022-12-17 ENCOUNTER — Inpatient Hospital Stay (HOSPITAL_COMMUNITY)
Admission: EM | Admit: 2022-12-17 | Discharge: 2022-12-19 | DRG: 070 | Disposition: A | Discharge status: Home Health | Payer: Medicare Other | Attending: Infectious Diseases | Admitting: Infectious Diseases

## 2022-12-17 DIAGNOSIS — G2581 Restless legs syndrome: Secondary | ICD-10-CM | POA: Diagnosis present

## 2022-12-17 DIAGNOSIS — Z1152 Encounter for screening for COVID-19: Secondary | ICD-10-CM

## 2022-12-17 DIAGNOSIS — Z79899 Other long term (current) drug therapy: Secondary | ICD-10-CM

## 2022-12-17 DIAGNOSIS — T383X6A Underdosing of insulin and oral hypoglycemic [antidiabetic] drugs, initial encounter: Secondary | ICD-10-CM | POA: Diagnosis present

## 2022-12-17 DIAGNOSIS — R4781 Slurred speech: Secondary | ICD-10-CM | POA: Diagnosis present

## 2022-12-17 DIAGNOSIS — Z91148 Patient's other noncompliance with medication regimen for other reason: Secondary | ICD-10-CM

## 2022-12-17 DIAGNOSIS — G9340 Encephalopathy, unspecified: Secondary | ICD-10-CM | POA: Diagnosis not present

## 2022-12-17 DIAGNOSIS — M898X9 Other specified disorders of bone, unspecified site: Secondary | ICD-10-CM | POA: Diagnosis present

## 2022-12-17 DIAGNOSIS — Z9049 Acquired absence of other specified parts of digestive tract: Secondary | ICD-10-CM

## 2022-12-17 DIAGNOSIS — M109 Gout, unspecified: Secondary | ICD-10-CM | POA: Diagnosis present

## 2022-12-17 DIAGNOSIS — G939 Disorder of brain, unspecified: Secondary | ICD-10-CM

## 2022-12-17 DIAGNOSIS — G8929 Other chronic pain: Secondary | ICD-10-CM | POA: Diagnosis present

## 2022-12-17 DIAGNOSIS — N186 End stage renal disease: Secondary | ICD-10-CM | POA: Diagnosis present

## 2022-12-17 DIAGNOSIS — Z8673 Personal history of transient ischemic attack (TIA), and cerebral infarction without residual deficits: Secondary | ICD-10-CM

## 2022-12-17 DIAGNOSIS — I252 Old myocardial infarction: Secondary | ICD-10-CM

## 2022-12-17 DIAGNOSIS — N189 Chronic kidney disease, unspecified: Secondary | ICD-10-CM | POA: Diagnosis present

## 2022-12-17 DIAGNOSIS — K760 Fatty (change of) liver, not elsewhere classified: Secondary | ICD-10-CM | POA: Diagnosis present

## 2022-12-17 DIAGNOSIS — I2510 Atherosclerotic heart disease of native coronary artery without angina pectoris: Secondary | ICD-10-CM | POA: Diagnosis present

## 2022-12-17 DIAGNOSIS — R1032 Left lower quadrant pain: Secondary | ICD-10-CM | POA: Diagnosis present

## 2022-12-17 DIAGNOSIS — E1122 Type 2 diabetes mellitus with diabetic chronic kidney disease: Secondary | ICD-10-CM | POA: Diagnosis present

## 2022-12-17 DIAGNOSIS — I120 Hypertensive chronic kidney disease with stage 5 chronic kidney disease or end stage renal disease: Secondary | ICD-10-CM | POA: Diagnosis present

## 2022-12-17 DIAGNOSIS — Z992 Dependence on renal dialysis: Secondary | ICD-10-CM

## 2022-12-17 DIAGNOSIS — Z794 Long term (current) use of insulin: Secondary | ICD-10-CM

## 2022-12-17 DIAGNOSIS — G9341 Metabolic encephalopathy: Secondary | ICD-10-CM

## 2022-12-17 DIAGNOSIS — N2581 Secondary hyperparathyroidism of renal origin: Secondary | ICD-10-CM | POA: Diagnosis present

## 2022-12-17 DIAGNOSIS — E1165 Type 2 diabetes mellitus with hyperglycemia: Secondary | ICD-10-CM

## 2022-12-17 DIAGNOSIS — I10 Essential (primary) hypertension: Secondary | ICD-10-CM | POA: Diagnosis present

## 2022-12-17 DIAGNOSIS — R739 Hyperglycemia, unspecified: Secondary | ICD-10-CM

## 2022-12-17 DIAGNOSIS — D631 Anemia in chronic kidney disease: Secondary | ICD-10-CM | POA: Diagnosis present

## 2022-12-17 DIAGNOSIS — E1140 Type 2 diabetes mellitus with diabetic neuropathy, unspecified: Secondary | ICD-10-CM | POA: Diagnosis present

## 2022-12-17 DIAGNOSIS — J189 Pneumonia, unspecified organism: Secondary | ICD-10-CM | POA: Diagnosis present

## 2022-12-17 DIAGNOSIS — K219 Gastro-esophageal reflux disease without esophagitis: Secondary | ICD-10-CM | POA: Diagnosis present

## 2022-12-17 HISTORY — DX: Disorder of kidney and ureter, unspecified: N28.9

## 2022-12-17 LAB — GLUCOSE, CAPILLARY
Glucose-Capillary: 395 mg/dL — ABNORMAL HIGH (ref 70–99)
Glucose-Capillary: 133 mg/dL — ABNORMAL HIGH (ref 70–99)
Glucose-Capillary: 139 mg/dL — ABNORMAL HIGH (ref 70–99)

## 2022-12-17 LAB — APTT: aPTT: 29 seconds (ref 24–36)

## 2022-12-17 LAB — DIFFERENTIAL
Abs Immature Granulocytes: 0.02 10*3/uL (ref 0.00–0.07)
Basophils Absolute: 0 10*3/uL (ref 0.0–0.1)
Basophils Relative: 1 %
Eosinophils Absolute: 0.2 10*3/uL (ref 0.0–0.5)
Eosinophils Relative: 3 %
Immature Granulocytes: 0 %
Lymphocytes Relative: 17 %
Lymphs Abs: 1.1 10*3/uL (ref 0.7–4.0)
Monocytes Absolute: 0.5 10*3/uL (ref 0.1–1.0)
Monocytes Relative: 8 %
Neutro Abs: 4.3 10*3/uL (ref 1.7–7.7)
Neutrophils Relative %: 71 %

## 2022-12-17 LAB — RESP PANEL BY RT-PCR (RSV, FLU A&B, COVID)  RVPGX2
Influenza A by PCR: NEGATIVE
Influenza B by PCR: NEGATIVE
Resp Syncytial Virus by PCR: NEGATIVE
SARS Coronavirus 2 by RT PCR: NEGATIVE

## 2022-12-17 LAB — I-STAT VENOUS BLOOD GAS, ED
Acid-Base Excess: 0 mmol/L (ref 0.0–2.0)
Bicarbonate: 23.7 mmol/L (ref 20.0–28.0)
Calcium, Ion: 0.94 mmol/L — ABNORMAL LOW (ref 1.15–1.40)
HCT: 30 % — ABNORMAL LOW (ref 36.0–46.0)
Hemoglobin: 10.2 g/dL — ABNORMAL LOW (ref 12.0–15.0)
O2 Saturation: 87 %
Potassium: 3.8 mmol/L (ref 3.5–5.1)
Sodium: 131 mmol/L — ABNORMAL LOW (ref 135–145)
TCO2: 25 mmol/L (ref 22–32)
pCO2, Ven: 33.9 mmHg — ABNORMAL LOW (ref 44–60)
pH, Ven: 7.452 — ABNORMAL HIGH (ref 7.25–7.43)
pO2, Ven: 50 mmHg — ABNORMAL HIGH (ref 32–45)

## 2022-12-17 LAB — COMPREHENSIVE METABOLIC PANEL
ALT: 11 U/L (ref 0–44)
AST: 13 U/L — ABNORMAL LOW (ref 15–41)
Albumin: 2.9 g/dL — ABNORMAL LOW (ref 3.5–5.0)
Alkaline Phosphatase: 82 U/L (ref 38–126)
Anion gap: 16 — ABNORMAL HIGH (ref 5–15)
BUN: 17 mg/dL (ref 8–23)
CO2: 24 mmol/L (ref 22–32)
Calcium: 8 mg/dL — ABNORMAL LOW (ref 8.9–10.3)
Chloride: 92 mmol/L — ABNORMAL LOW (ref 98–111)
Creatinine, Ser: 3.01 mg/dL — ABNORMAL HIGH (ref 0.44–1.00)
GFR, Estimated: 16 mL/min — ABNORMAL LOW (ref >60)
Glucose, Bld: 509 mg/dL (ref 70–99)
Potassium: 3.4 mmol/L — ABNORMAL LOW (ref 3.5–5.1)
Sodium: 132 mmol/L — ABNORMAL LOW (ref 135–145)
Total Bilirubin: 0.8 mg/dL (ref 0.3–1.2)
Total Protein: 5.3 g/dL — ABNORMAL LOW (ref 6.5–8.1)

## 2022-12-17 LAB — AMMONIA: Ammonia: 26 umol/L (ref 9–35)

## 2022-12-17 LAB — BLOOD GAS, VENOUS
Acid-Base Excess: 2.9 mmol/L — ABNORMAL HIGH (ref 0.0–2.0)
Bicarbonate: 29.8 mmol/L — ABNORMAL HIGH (ref 20.0–28.0)
Drawn by: 1470
O2 Saturation: 63.1 %
Patient temperature: 35.7
pCO2, Ven: 51 mmHg (ref 44–60)
pH, Ven: 7.37 (ref 7.25–7.43)
pO2, Ven: 36 mmHg (ref 32–45)

## 2022-12-17 LAB — I-STAT CHEM 8, ED
BUN: 17 mg/dL (ref 8–23)
Calcium, Ion: 0.74 mmol/L — CL (ref 1.15–1.40)
Chloride: 98 mmol/L (ref 98–111)
Creatinine, Ser: 3.3 mg/dL — ABNORMAL HIGH (ref 0.44–1.00)
Glucose, Bld: 530 mg/dL (ref 70–99)
HCT: 28 % — ABNORMAL LOW (ref 36.0–46.0)
Hemoglobin: 9.5 g/dL — ABNORMAL LOW (ref 12.0–15.0)
Potassium: 3.4 mmol/L — ABNORMAL LOW (ref 3.5–5.1)
Sodium: 129 mmol/L — ABNORMAL LOW (ref 135–145)
TCO2: 23 mmol/L (ref 22–32)

## 2022-12-17 LAB — CBG MONITORING, ED
Glucose-Capillary: 521 mg/dL (ref 70–99)
Glucose-Capillary: 409 mg/dL — ABNORMAL HIGH (ref 70–99)

## 2022-12-17 LAB — CBC
HCT: 29.8 % — ABNORMAL LOW (ref 36.0–46.0)
Hemoglobin: 9.1 g/dL — ABNORMAL LOW (ref 12.0–15.0)
MCH: 28 pg (ref 26.0–34.0)
MCHC: 30.5 g/dL (ref 30.0–36.0)
MCV: 91.7 fL (ref 80.0–100.0)
Platelets: 207 10*3/uL (ref 150–400)
RBC: 3.25 MIL/uL — ABNORMAL LOW (ref 3.87–5.11)
RDW: 15.2 % (ref 11.5–15.5)
WBC: 6.1 10*3/uL (ref 4.0–10.5)
nRBC: 0 % (ref 0.0–0.2)

## 2022-12-17 LAB — PHOSPHORUS: Phosphorus: 5.3 mg/dL — ABNORMAL HIGH (ref 2.5–4.6)

## 2022-12-17 LAB — HEPATITIS B SURFACE ANTIGEN: Hepatitis B Surface Ag: NONREACTIVE

## 2022-12-17 LAB — PROTIME-INR
INR: 1.2 (ref 0.8–1.2)
Prothrombin Time: 15.4 seconds — ABNORMAL HIGH (ref 11.4–15.2)

## 2022-12-17 LAB — MAGNESIUM: Magnesium: 2.3 mg/dL (ref 1.7–2.4)

## 2022-12-17 LAB — ETHANOL: Alcohol, Ethyl (B): <10 mg/dL (ref <10)

## 2022-12-17 LAB — BRAIN NATRIURETIC PEPTIDE: B Natriuretic Peptide: 2006 pg/mL — ABNORMAL HIGH (ref 0.0–100.0)

## 2022-12-17 LAB — PROCALCITONIN: Procalcitonin: 0.67 ng/mL

## 2022-12-17 MED ORDER — ONDANSETRON HCL 4 MG/2ML IJ SOLN
4.0000 mg | Freq: Four times a day (QID) | INTRAMUSCULAR | Status: DC | PRN
  Administered 2022-12-17: 4 mg via INTRAVENOUS
  Filled 2022-12-17: qty 2

## 2022-12-17 MED ORDER — POTASSIUM CHLORIDE 10 MEQ/100ML IV SOLN
10.0000 meq | Freq: Once | INTRAVENOUS | Status: AC
  Administered 2022-12-17: 10 meq via INTRAVENOUS
  Filled 2022-12-17: qty 100

## 2022-12-17 MED ORDER — SODIUM CHLORIDE 0.9 % IV SOLN: 2.0000 g | INTRAVENOUS | Status: DC

## 2022-12-17 MED ORDER — HEPARIN SODIUM (PORCINE) 5000 UNIT/ML IJ SOLN
5000.0000 [IU] | Freq: Three times a day (TID) | INTRAMUSCULAR | Status: DC
  Administered 2022-12-17 – 2022-12-19 (×7): 5000 [IU] via SUBCUTANEOUS
  Filled 2022-12-17 (×7): qty 1

## 2022-12-17 MED ORDER — INSULIN ASPART 100 UNIT/ML IJ SOLN
0.0000 [IU] | INTRAMUSCULAR | Status: DC
  Administered 2022-12-17: 5 [IU] via SUBCUTANEOUS
  Administered 2022-12-17: 6 [IU] via SUBCUTANEOUS
  Administered 2022-12-18 (×3): 2 [IU] via SUBCUTANEOUS

## 2022-12-17 MED ORDER — ACETAMINOPHEN 325 MG PO TABS
650.0000 mg | ORAL_TABLET | Freq: Four times a day (QID) | ORAL | Status: DC | PRN
  Administered 2022-12-18 – 2022-12-19 (×4): 650 mg via ORAL
  Filled 2022-12-17 (×4): qty 2

## 2022-12-17 MED ORDER — LIDOCAINE HCL (PF) 1 % IJ SOLN: 5.0000 mL | INTRAMUSCULAR | Status: DC | PRN

## 2022-12-17 MED ORDER — ACETAMINOPHEN 650 MG RE SUPP: 650.0000 mg | Freq: Four times a day (QID) | RECTAL | Status: DC | PRN

## 2022-12-17 MED ORDER — LACTATED RINGERS IV BOLUS
1000.0000 mL | Freq: Once | INTRAVENOUS | Status: AC
  Administered 2022-12-17: 1000 mL via INTRAVENOUS

## 2022-12-17 MED ORDER — LIDOCAINE-PRILOCAINE 2.5-2.5 % EX CREA
1.0000 | TOPICAL_CREAM | CUTANEOUS | Status: DC | PRN
  Filled 2022-12-17: qty 5

## 2022-12-17 MED ORDER — HEPARIN SODIUM (PORCINE) 1000 UNIT/ML DIALYSIS
40.0000 [IU]/kg | INTRAMUSCULAR | Status: DC | PRN
  Administered 2022-12-17: 2800 [IU] via INTRAVENOUS_CENTRAL
  Filled 2022-12-17 (×2): qty 3

## 2022-12-17 MED ORDER — SODIUM CHLORIDE 0.9 % IV SOLN
500.0000 mg | INTRAVENOUS | Status: DC
  Administered 2022-12-18: 500 mg via INTRAVENOUS
  Filled 2022-12-17: qty 5

## 2022-12-17 MED ORDER — SODIUM CHLORIDE 0.9 % IV SOLN
2.0000 g | Freq: Once | INTRAVENOUS | Status: AC
  Administered 2022-12-17: 2 g via INTRAVENOUS
  Filled 2022-12-17: qty 20

## 2022-12-17 MED ORDER — INSULIN GLARGINE-YFGN 100 UNIT/ML ~~LOC~~ SOLN
10.0000 [IU] | Freq: Every day | SUBCUTANEOUS | Status: DC
  Administered 2022-12-17 – 2022-12-18 (×2): 10 [IU] via SUBCUTANEOUS
  Filled 2022-12-17 (×3): qty 0.1

## 2022-12-17 MED ORDER — AMLODIPINE BESYLATE 5 MG PO TABS
5.0000 mg | ORAL_TABLET | Freq: Every day | ORAL | Status: DC
  Administered 2022-12-17 – 2022-12-19 (×3): 5 mg via ORAL
  Filled 2022-12-17 (×3): qty 1

## 2022-12-17 MED ORDER — ASPIRIN 81 MG PO TBEC
81.0000 mg | DELAYED_RELEASE_TABLET | Freq: Every day | ORAL | Status: DC
  Administered 2022-12-17 – 2022-12-19 (×3): 81 mg via ORAL
  Filled 2022-12-17 (×3): qty 1

## 2022-12-17 MED ORDER — CHLORHEXIDINE GLUCONATE CLOTH 2 % EX PADS
6.0000 | MEDICATED_PAD | Freq: Every day | CUTANEOUS | Status: DC
  Administered 2022-12-17: 6 via TOPICAL

## 2022-12-17 MED ORDER — SODIUM CHLORIDE 0.9 % IV SOLN
500.0000 mg | Freq: Once | INTRAVENOUS | Status: AC
  Administered 2022-12-17: 500 mg via INTRAVENOUS
  Filled 2022-12-17: qty 5

## 2022-12-17 MED ORDER — ONDANSETRON HCL 4 MG PO TABS: 4.0000 mg | ORAL_TABLET | Freq: Four times a day (QID) | ORAL | Status: DC | PRN

## 2022-12-17 MED ORDER — PENTAFLUOROPROP-TETRAFLUOROETH EX AERO: 1.0000 | INHALATION_SPRAY | CUTANEOUS | Status: DC | PRN

## 2022-12-17 MED ORDER — SODIUM CHLORIDE 0.9 % IV SOLN
2.0000 g | INTRAVENOUS | Status: DC
  Administered 2022-12-18: 2 g via INTRAVENOUS
  Filled 2022-12-17: qty 20

## 2022-12-17 NOTE — Code Documentation (Addendum)
Responded to Code Stroke called at 0541 for AMS and slurred speech, [id number] last night. Pt arrived at 0609, CBG-521, NIH-8, CT head negative for acute changes. TNK not given-OSW. Pt has no LVO signs. Plan for VS/neuro checks q2h x 12 hours, then [id number], and metabolic workup.

## 2022-12-17 NOTE — Hospital Course (Addendum)
Ruth Watkins is a 72 y.o. 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  Acute metabolic encephalopathy Patient was found by husband on the ground confused.  Code stroke was called, however CT head was negative and neurology evaluation suggested acute metabolic encephalopathy rather than a stroke.  Her vitals are stable and afebrile on presentation without leukocytosis.  Infectious workup revealed left lower lobe opacity on chest x-ray concerning for pneumonia. Procalcitonin was elevated at 0.6.  She was started on empiric treatment for CAP coverage. She was also markedly hyperglycemic at presentation which could have contributed to confusion, no signs of anion gap metabolic acidosis concerning for hyperglycemic emergency.  Home medications include gabapentin which could be contributing to presentation. Mentation improved and at time of discharge was A&Ox4. Advised about taking lower dose gabapentin and try tylenol for her other chronic pains which she is agreeable to.   Patient to discharge with home health PT, OT and social work. Received DME for BSC/3in1 prior to discharge.   Left lower lobe pneumonia Chest x-ray showed left lower lobe opacity.  Patient was afebrile with no leukocytosis.  She was started on CAP coverage with ceftriaxone and azithromycin. Procalcitonin elevated at 0.6. Ambulatory saturations were good on room air.  She was transition to p.o. antibiotics and will complete total 5 day course. Discharged with doxycycline and has f/u with her PCP at [facility].   Type 2 diabetes with hyperglycemia Hemoglobin A1c elevated 11.2 in January.  Home medications include glargine 20 units nightly and sliding scale with meals. Glucose was 500 on arrival without signs concerning for hyperglycemic emergency. Restarted home dose  of long-acting insulin 20 units daily. CBGs improved. Patient to f/u with her PCP.   ESRD on HD (TThS) Received inpatient HD on 3/30. Patient to resume normal HD schedule of TThS with her outpatient dialysis center.   History of CVA  Was on DAPT but held in February in setting of acute GIB. No evidence of active bleeding. Hemoglobin stable so restarted home ASA 81 mg daily.    Hypertension BP mostly normotensive and at times mildly elevated. Restarted her home amlodipine 5 mg daily. Held losartan in setting of normotension initially, can f/u with PCP about resuming it.   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 Hgb has been stable during this admission. B12 and folate normal. Ferritin normal and mildly low iron. Likely in setting of anemia of CKD.   Restless leg syndrome At home taking Requip 2 mg nightly and gabapentin possibly 600 mg TID although advised previously to decrease to 100 mg BID.  Held initially due to AMS but restarted Requip and lower dose gabapentin once mentation improved. No changes in mental status on lower dose gabapentin. Advised patient for feet and hand pain to try OTC tylenol rather than over taking gabapentin which she is agreeable to.

## 2022-12-17 NOTE — ED Notes (Signed)
ED TO INPATIENT HANDOFF REPORT  ED Nurse Name and Phone # Ophelia Charter RN  [phone]  S Name/Age/Gender Ruth Watkins 72 y.o. female Room/Bed: 035C/035C  Code Status   Code Status: Full Code  Home/SNF/Other Home Patient oriented to: Is this baseline? No   Triage Complete: Triage complete  Chief Complaint Encephalopathy acute [G93.40]  Triage Note Pt BIB [facility] from home c/o a code stroke. Pt's LKW was 2200, pt got up at 0300 this morning when she had a fall and family found her on the ground. Per family pt was confused, hallucinating and had slurred speech.    Allergies No Known Allergies  Level of Care/Admitting Diagnosis ED Disposition     ED Disposition  Admit   Condition  --   Comment  Hospital Area: [place] [[id number]]  Level of Care: Med-Surg [16]  May place patient in observation at [facility] or [place] if equivalent level of care is available:: Yes  Covid Evaluation: Symptomatic Person Under Investigation (PUI) or recent exposure (last 10 days) *Testing Required*  Diagnosis: Encephalopathy acute [id number]  Admitting Physician: Lucious Groves [[id number]]  Attending Physician: Lucious Groves [[id number]]          B Medical/Surgery History Past Medical History:  Diagnosis Date   Diabetes ([place])    GERD (gastroesophageal reflux disease)    Gout    History of cerebrovascular accident    History of myocardial infarction    Ischemic stroke ([place])    Major depressive disorder    Recurrent pancreatitis    Renal disorder    Restless leg syndrome    Steatosis of liver    Past Surgical History:  Procedure Laterality Date   CHOLECYSTECTOMY  08/25/2017   [facility]     A IV Location/Drains/Wounds Patient Lines/Drains/Airways Status     Active Line/Drains/Airways     Name Placement date Placement time Site Days   Peripheral IV 12/17/22 20 G Right Antecubital 12/17/22  0639  Antecubital  less than 1             Intake/Output Last 24 hours No intake or output data in the 24 hours ending 12/17/22 0941  Labs/Imaging Results for orders placed or performed during the hospital encounter of 12/17/22 (from the past 48 hour(s))  CBG monitoring, ED     Status: Abnormal   Collection Time: 12/17/22  6:12 AM  Result Value Ref Range   Glucose-Capillary 521 (HH) 70 - 99 mg/dL    Comment: Glucose reference range applies only to samples taken after fasting for at least 8 hours.  Protime-INR     Status: Abnormal   Collection Time: 12/17/22  6:16 AM  Result Value Ref Range   Prothrombin Time 15.4 (H) 11.4 - 15.2 seconds   INR 1.2 0.8 - 1.2    Comment: (NOTE) INR goal varies based on device and disease states. Performed at [facility], [place] [address] [place], [place] [address]   APTT     Status: None   Collection Time: 12/17/22  6:16 AM  Result Value Ref Range   aPTT 29 24 - 36 seconds    Comment: Performed at [place] [address] [place] [address]  CBC     Status: Abnormal   Collection Time: 12/17/22  6:16 AM  Result Value Ref Range   WBC 6.1 4.0 - 10.5 K/uL   RBC 3.25 (L) 3.87 - 5.11 MIL/uL  Hemoglobin 9.1 (L) 12.0 - 15.0 g/dL   HCT 29.8 (L) 36.0 - 46.0 %   MCV 91.7 80.0 - 100.0 fL   MCH 28.0 26.0 - 34.0 pg   MCHC 30.5 30.0 - 36.0 g/dL   RDW 15.2 11.5 - 15.5 %   Platelets 207 150 - 400 K/uL   nRBC 0.0 0.0 - 0.2 %    Comment: Performed at [place] [address] [place], [place] [address]  Differential     Status: None   Collection Time: 12/17/22  6:16 AM  Result Value Ref Range   Neutrophils Relative % 71 %   Neutro Abs 4.3 1.7 - 7.7 K/uL   Lymphocytes Relative 17 %   Lymphs Abs 1.1 0.7 - 4.0 K/uL   Monocytes Relative 8 %   Monocytes Absolute 0.5 0.1 - 1.0 K/uL   Eosinophils Relative 3 %   Eosinophils Absolute 0.2 0.0 - 0.5 K/uL   Basophils Relative 1 %   Basophils Absolute 0.0 0.0 - 0.1 K/uL   Immature Granulocytes 0 %   Abs Immature  Granulocytes 0.02 0.00 - 0.07 K/uL    Comment: Performed at [facility], [place] [address], [place], [place] [address]  Comprehensive metabolic panel     Status: Abnormal   Collection Time: 12/17/22  6:16 AM  Result Value Ref Range   Sodium 132 (L) 135 - 145 mmol/L   Potassium 3.4 (L) 3.5 - 5.1 mmol/L   Chloride 92 (L) 98 - 111 mmol/L   CO2 24 22 - 32 mmol/L   Glucose, Bld 509 (HH) 70 - 99 mg/dL    Comment: CRITICAL RESULT CALLED TO, READ BACK BY AND VERIFIED WITH BRITTANY BECK RN 12/17/22 0659 M KOROLESKI Glucose reference range applies only to samples taken after fasting for at least 8 hours.    BUN 17 8 - 23 mg/dL   Creatinine, Ser 3.01 (H) 0.44 - 1.00 mg/dL   Calcium 8.0 (L) 8.9 - 10.3 mg/dL   Total Protein 5.3 (L) 6.5 - 8.1 g/dL   Albumin 2.9 (L) 3.5 - 5.0 g/dL   AST 13 (L) 15 - 41 U/L   ALT 11 0 - 44 U/L   Alkaline Phosphatase 82 38 - 126 U/L   Total Bilirubin 0.8 0.3 - 1.2 mg/dL   GFR, Estimated 16 (L) >60 mL/min    Comment: (NOTE) Calculated using the CKD-EPI Creatinine Equation (2021)    Anion gap 16 (H) 5 - 15    Comment: Performed at [facility], [place] [address] [place], [place] [address]  I-stat chem 8, ED     Status: Abnormal   Collection Time: 12/17/22  6:21 AM  Result Value Ref Range   Sodium 129 (L) 135 - 145 mmol/L   Potassium 3.4 (L) 3.5 - 5.1 mmol/L   Chloride 98 98 - 111 mmol/L   BUN 17 8 - 23 mg/dL   Creatinine, Ser 3.30 (H) 0.44 - 1.00 mg/dL   Glucose, Bld 530 (HH) 70 - 99 mg/dL    Comment: Glucose reference range applies only to samples taken after fasting for at least 8 hours.   Calcium, Ion 0.74 (LL) 1.15 - 1.40 mmol/L   TCO2 23 22 - 32 mmol/L   Hemoglobin 9.5 (L) 12.0 - 15.0 g/dL   HCT 28.0 (L) 36.0 - 46.0 %   Comment NOTIFIED PHYSICIAN   Ethanol     Status: None   Collection Time: 12/17/22  7:23 AM  Result Value Ref Range   Alcohol, Ethyl (B) <10 <10 mg/dL    Comment: (NOTE) Lowest detectable limit for serum alcohol is 10  mg/dL.  For medical purposes only. Performed at [facility], [place] [address] [place], [place] [address]   Ammonia     Status: None   Collection Time: 12/17/22  7:23 AM  Result Value Ref Range   Ammonia 26 9 - 35 umol/L    Comment: Performed at [facility], [place] [address] [place], [place] [address]   DG Chest Portable 1 View  Result Date: 12/17/2022 CLINICAL DATA:  72 year old female with history of confusion and hallucination. Slurred speech. EXAM: PORTABLE CHEST 1 VIEW COMPARISON:  Chest x-ray 10/11/2022. FINDINGS: Lung volumes are low. Poorly defined opacity in the left lung base which may reflect atelectasis and/or consolidation, likely with superimposed small left pleural effusion. Interstitial prominence and peribronchial cuffing noted throughout the mid to lower lungs bilaterally (left-greater-than-right). No pneumothorax. No evidence of pulmonary edema. Heart size is normal. Upper mediastinal contours are within normal limits. IMPRESSION: 1. Findings are concerning for probable bronchitis with left lower lobe bronchopneumonia and small left parapneumonic pleural effusion, as above. Electronically Signed   By: Vinnie Langton M.D.   On: 12/17/2022 07:01   CT HEAD CODE STROKE WO CONTRAST  Result Date: 12/17/2022 CLINICAL DATA:  Code stroke. EXAM: CT HEAD WITHOUT CONTRAST TECHNIQUE: Contiguous axial images were obtained from the base of the skull through the vertex without intravenous contrast. RADIATION DOSE REDUCTION: This exam was performed according to the departmental dose-optimization program which includes automated exposure control, adjustment of the mA and/or kV according to patient size and/or use of iterative reconstruction technique. COMPARISON:  01/12/2022 FINDINGS: Brain: No evidence of acute infarction, hemorrhage, hydrocephalus, extra-axial collection or mass lesion/mass effect. Vascular: No hyperdense vessel or unexpected calcification. Skull: Normal. Negative  for fracture or focal lesion. Sinuses/Orbits: No acute finding. Other: These results were communicated to Dr. Lorrin Goodell at 6:31 am on 12/17/2022 by text page via the [facility] messaging system. ASPECTS [facility] Stroke Program Early CT Score) Not scored without specified symptoms IMPRESSION: Stable and negative head CT. Electronically Signed   By: Jorje Guild M.D.   On: 12/17/2022 06:32    Pending Labs Unresulted Labs (From admission, onward)     Start     Ordered   12/18/22 [id number]  Basic metabolic panel  Tomorrow morning,   R        12/17/22 0924   12/18/22 0500  CBC  Tomorrow morning,   R        12/17/22 0924   12/17/22 0932  Resp panel by RT-PCR (RSV, Flu A&B, Covid) Anterior Nasal Swab  (Tier 2 - SARS Coronavirus 2 by RT PCR (hospital order, performed in [facility] hospital lab) *cepheid single result test*)  Once,   R        12/17/22 0931   12/17/22 0926  Blood gas, venous  Once,   R        12/17/22 0925   12/17/22 0926  Procalcitonin  Once,   R       References:    Procalcitonin Lower Respiratory Tract Infection AND Sepsis Procalcitonin Algorithm   12/17/22 0926   12/17/22 0925  Brain natriuretic peptide  Once,   R        12/17/22 0924   12/17/22 0923  Magnesium  Once,   R        12/17/22 0924  12/17/22 0923  Phosphorus  Once,   R        12/17/22 0924   12/17/22 0819  Hepatitis B surface antigen  (New Admission Hemo Labs (Hepatitis B))  Once,   URGENT        12/17/22 0819   12/17/22 0819  Hepatitis B surface antibody,quantitative  (New Admission Hemo Labs (Hepatitis B))  Once,   URGENT        12/17/22 0819   12/17/22 0709  Blood culture (routine x 2)  BLOOD CULTURE X 2,   R      12/17/22 0709   12/17/22 0617  Urine rapid drug screen (hosp performed)  Once,   STAT        12/17/22 0617   12/17/22 0617  Urinalysis, Routine w reflex microscopic -Urine, Clean Catch  Once,   URGENT       Question:  Specimen Source  Answer:  Urine, Clean Catch   12/17/22 0617             Vitals/Pain Today's Vitals   12/17/22 0637 12/17/22 0700 12/17/22 0757 12/17/22 0758  BP:  (!) 153/62 (!) 140/69   Pulse:  64 69   Resp:  18 18   Temp:   97.7 F (36.5 C)   TempSrc:   Oral   SpO2:  92% 98%   Weight:      PainSc: 0-No pain  0-No pain 0-No pain    Isolation Precautions No active isolations  Medications Medications  potassium chloride 10 mEq in 100 mL IVPB (10 mEq Intravenous New Bag/Given 12/17/22 0936)  Chlorhexidine Gluconate Cloth 2 % PADS 6 each (has no administration in time range)  heparin injection 5,000 Units (has no administration in time range)  acetaminophen (TYLENOL) tablet 650 mg (has no administration in time range)    Or  acetaminophen (TYLENOL) suppository 650 mg (has no administration in time range)  ondansetron (ZOFRAN) tablet 4 mg (has no administration in time range)    Or  ondansetron (ZOFRAN) injection 4 mg (has no administration in time range)  insulin aspart (novoLOG) injection 0-6 Units (has no administration in time range)  insulin glargine-yfgn (SEMGLEE) injection 10 Units (has no administration in time range)  lactated ringers bolus 1,000 mL (1,000 mLs Intravenous New Bag/Given 12/17/22 0824)  cefTRIAXone (ROCEPHIN) 2 g in sodium chloride 0.9 % 100 mL IVPB (0 g Intravenous Stopped 12/17/22 0829)  azithromycin (ZITHROMAX) 500 mg in sodium chloride 0.9 % 250 mL IVPB (0 mg Intravenous Stopped 12/17/22 0932)    Mobility walks     Focused Assessments Neuro Assessment Handoff:  Swallow screen pass? Yes    NIH Stroke Scale  Dizziness Present: No Headache Present: No Interval: Shift assessment Level of Consciousness (1a.)   : Alert, keenly responsive LOC Questions (1b. )   : Answers both questions correctly LOC Commands (1c. )   : Performs both tasks correctly Best Gaze (2. )  : Normal Visual (3. )  : No visual loss Facial Palsy (4. )    : Normal symmetrical movements Motor Arm, Left (5a. )   : Drift Motor Arm, Right (5b. ) :  Drift Motor Leg, Left (6a. )  : Drift Motor Leg, Right (6b. ) : Drift Limb Ataxia (7. ): Absent Sensory (8. )  : Normal, no sensory loss Best Language (9. )  : No aphasia Dysarthria (10. ): Normal Extinction/Inattention (11.)   : No Abnormality Complete NIHSS TOTAL: 4 Last date known well: 12/16/22  Last time known well: 2200 Neuro Assessment:   Neuro Checks:   Initial (12/17/22 [id number])  Has TPA been given? No If patient is a Neuro Trauma and patient is going to OR before floor call report to [place] nurse: [phone] or [phone]   R Recommendations: See Admitting Provider Note  Report given to:   Additional Notes Patient is alert , still sleepy however arousable.

## 2022-12-17 NOTE — H&P (Signed)
Date: 12/17/2022               Patient Name:  Ruth Watkins MRN: [id number]  DOB: 1951/05/09 Age / Sex: 72 y.o., female   PCP: Pcp, No         Medical Service: Internal Medicine Teaching Service         Attending Physician: Dr. Lucious Groves, DO    First Contact: Dr. Angelique Blonder  Pager: [phone]  Second Contact: Dr. 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   Patient is drowsy and intermittently responsive to questions, most of history per daughter and grandson who are at bedside.   Patient was found sitting on the ground by her husband around 0300 this AM. He went to get patient's grandson who is visiting, on leave from the [employer] to help pick up patient. Patient's grandson states that she seemed to be at her baseline level of mentation at that time, but he did not ask at that time what lead to her being on the ground.    As the patient was on the couch she started reaching out for stuff and her speech was slurred prompting her family to call EMS.   Patient did not report a cough, fever or chills, to her family last night. She also did not report dysuria.    This AM she denies SOB, chest pain, cough, diarrhea or constipation.    In the ED, a code stroke was called, CT head was negative for stroke. Patient remained drowsy and confused. And it was recommended that toxic metabolic work up be pursued. CMP was notable for elevated BG 509. ETOH was negative. Ammonia was WNL. PCO2 on VBG WNL. Normocytic anemia w/ no leukocytosis. EKG was NSR. CXR was notable for probable bronchitis w/ LLL  bronchopna and sm L parapna pleural effusion per radiology read.   Medications:  Daughter does not recall patient's medications  [facility]  Held lasix 20mg  BID and losartan 100mg  qd  Allopruinol 100mg  qd  Ergocalciferol 50k u Lexapro 10mg  qd Gabpentin 100mg  TID  SSI with meals 20u glargine nightly  Iron 150mg  qd  Mag Omega 3 Protonix 40mg  qd Ropinorole 2mg  qd Sevelamer 800mg  2 tabs TID with meals Tramadol 50mg  q6h PRN  Trazodone 50mg  qhs PRN  Allergies: Allergies as of 12/17/2022   (No Known Allergies)   Past Medical History:  Diagnosis Date   Diabetes ([place])    GERD (gastroesophageal reflux disease)    Gout    History of cerebrovascular accident    History of myocardial infarction    Ischemic stroke ([place])    Major depressive disorder    Recurrent pancreatitis    Renal disorder    Restless leg syndrome    Steatosis of liver     Family History:  History reviewed. No pertinent family history.   Social History:  Social History   Tobacco Use   Smoking status: Never   Smokeless tobacco: Never   Lives with her husband Daughter helps manage her medications  She sometimes does not take her insulin and other medications appropriately (usually she does not take them) She is able to walk with out assistive devices at baseline.   Review of Systems: A complete ROS was negative except as per HPI.   Physical Exam: Blood pressure (!) 164/65, pulse 70, temperature 97.8 F (36.6 C), temperature source Oral, resp. rate 18, height 5\' 2"  (1.575 m), weight 70.5 kg, SpO2 (!) 65 %.   Constitutional: Drowsy and in no distress.  HENT:  Head: Normocephalic and atraumatic.  Eyes: EOM are normal.  Neck: Normal range of motion.  Cardiovascular: Normal rate, regular rhythm, intact distal pulses. No gallop and no friction rub.  No murmur heard.  Pulmonary: Non labored breathing on room air, no wheezing or rales  Abdominal: Soft. Normal bowel sounds. Non distended  and non tender Extremities: 1+ edema of BLE  Neurological: Drowsy, wakes to voice oriented to person, place, and time. Non focal  Skin: Skin is warm and dry.   EKG: personally reviewed my interpretation is NSR   DG Chest Portable 1 View  Result Date: 12/17/2022 CLINICAL DATA:  72 year old female with history of confusion and hallucination. Slurred speech. EXAM: PORTABLE CHEST 1 VIEW COMPARISON:  Chest x-ray 10/11/2022. FINDINGS: Lung volumes are low. Poorly defined opacity in the left lung base which may reflect atelectasis and/or consolidation, likely with superimposed small left pleural effusion. Interstitial prominence and peribronchial cuffing noted throughout the mid to lower lungs bilaterally (left-greater-than-right). No pneumothorax. No evidence of pulmonary edema. Heart size is normal. Upper mediastinal contours are within normal limits. IMPRESSION: 1. Findings are concerning for probable bronchitis with left lower lobe bronchopneumonia and small left parapneumonic pleural effusion, as above. Electronically Signed   By: Vinnie Langton M.D.   On: 12/17/2022 07:01   CT HEAD CODE STROKE WO CONTRAST  Result Date: 12/17/2022 CLINICAL DATA:  Code stroke. EXAM: CT HEAD WITHOUT CONTRAST TECHNIQUE: Contiguous axial images were obtained from the base of the skull through the vertex without intravenous contrast. RADIATION DOSE REDUCTION: This exam was performed according to the departmental dose-optimization program which includes automated exposure control, adjustment of the mA and/or kV according to patient size and/or use of iterative reconstruction technique. COMPARISON:  01/12/2022 FINDINGS: Brain: No evidence of acute infarction, hemorrhage, hydrocephalus, extra-axial collection or mass lesion/mass effect. Vascular: No hyperdense vessel or unexpected calcification. Skull: Normal. Negative for fracture or focal lesion. Sinuses/Orbits: No acute finding. Other: These results were communicated to Dr.  Lorrin Goodell at 6:31 am on 12/17/2022 by text page via the [facility] messaging system. ASPECTS [facility] Stroke Program Early CT Score) Not scored without specified symptoms IMPRESSION: Stable and negative head CT. Electronically Signed   By: Jorje Guild M.D.   On: 12/17/2022 06:32     Assessment & Plan by Problem: Principal Problem:   Encephalopathy acute  #Acute toxic metabolic encephalopathy Unclear etiology. CT head with out evidence of acute pathology. No history of seizure and EEG was not obtained. CXR with findings concerning for possible PNA.  VBG w/o elevated PCO2. Patient was noted to be hyperglycemic on admission. Ammonia and ETOH negative.  -Continue CAP coverage with CTX and azithromycin -Unclear home dose insulin, start low dose basal + SSI, q4h BG checks while NPO -Hold on MRI brain per neuro, consider if remains altered in the AM -Unclear if anuric, UDS and UA pending -F/u procalcitonin and COVID/Flu/RSV -F/u BCx   H/o CVA 2021 On DAPT. Her plavix was held in 10/2022 in setting of GIB.  Will resume ASA  81mg    #ESRD ON HD TThS Nephrology consulted. HD this AM.   #HTN On amlodipine 5mg  and losartan 100mg  nightly -Resume amlodipine 5mg    Continue to hold given normotension #normocytic anemia  #Recent GIB Patient hospitalized at [facility] 10/2022. 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 D/c Hgb was 9.4. This admission hgb 9.1.   Iron panel one month ago with ferritin 66,  TIBC 261 - 478 mcg/dL 251 Low   % SATURATION 15 - 50 % 18  Iron 50 - 212 mcg/dL 46 Low   Transferrin 212 - 360 MG/DL 179 Low    Repeat iron panel, vitb12, and folate   Severe RLS She is on requip 2mg  qhs and gabapentin. Will hold these in the setting of her altered mental status.   T2DM Last A1c 11.2 Start low dose basal (10u) and SSI  Dispo: Admit patient to Observation with expected length of stay less than 2 midnights.  Signed: Rick Duff,  MD 12/17/2022, 11:02 AM  After 5pm on weekdays and 1pm on weekends: On Call pager: [phone]

## 2022-12-17 NOTE — Progress Notes (Signed)
New Admission Note:   Arrival Method: stretcher Mental Orientation: awake, alert and oriented to person, place and time, disoriented to situation Telemetry: box 12, NSR Assessment: Completed Skin: C/D/I, ecchymotic area noted to right shoulder blade IV: right antecub, SL Pain: denies Tubes: Safety Measures: Safety Fall Prevention Plan has been given, discussed and signed Admission: Completed [facility] Orientation: Patient has been orientated to the room, unit and staff.  Family: admission screenings done with daughter via phone conversation.  Bedside swallow eval done. Pt. Able to tol. Applesauce and water without difficulty. MD aware and diet ordered  Orders have been reviewed and implemented. Will continue to monitor the patient. Call light has been placed within reach and bed alarm has been activated.   [name], RN

## 2022-12-17 NOTE — ED Triage Notes (Signed)
Pt BIB [facility] from home c/o a code stroke. Pt's LKW was 2200, pt got up at 0300 this morning when she had a fall and family found her on the ground. Per family pt was confused, hallucinating and had slurred speech.

## 2022-12-17 NOTE — Progress Notes (Signed)
PT Cancellation Note  Patient Details Name: Ruth Watkins MRN: [id number] DOB: 12-Dec-1950   Cancelled Treatment:    Reason Eval/Treat Not Completed: Patient at procedure or test/unavailable Pt recently admitted and now at HD.  Will f/u at later date. Abran Richard, PT [facility] [phone]   Karlton Lemon 12/17/2022, 3:06 PM

## 2022-12-17 NOTE — Progress Notes (Signed)
Received patient in bed to unit.  Alert and oriented.  Informed consent signed and in chart.   TX duration: 3hrs  Patient tolerated well.  Alert, without acute distress.  Hand-off given to patient's nurse.   Access used: L AVF Access issues: None  Total UF removed: 1576ml Medication(s) given: None  Post HD weight: 70.8kg   12/17/22 1819  Vitals  Temp 97.9 F (36.6 C)  Temp Source Oral  BP (!) 130/115  MAP (mmHg) 122  BP Location Right Arm  BP Method Automatic  Patient Position (if appropriate) Lying  Pulse Rate 67  Pulse Rate Source Monitor  ECG Heart Rate 67  Resp 17  Oxygen Therapy  SpO2 98 %  O2 Device Room Air  Patient Activity (if Appropriate) In bed  Pulse Oximetry Type Continuous  During Treatment Monitoring  Intra-Hemodialysis Comments Tx completed;Tolerated well  Post Treatment  Dialyzer Clearance Lightly streaked  Duration of HD Treatment -hour(s) 3 hour(s)  Liters Processed 72  Fluid Removed (mL) 1500 mL  Tolerated HD Treatment Yes  Post-Hemodialysis Comments Goal Met  AVG/AVF Arterial Site Held (minutes) 8 minutes  AVG/AVF Venous Site Held (minutes) 8 minutes  Fistula / Graft Left Upper arm Arteriovenous fistula  No placement date or time found.   Placed prior to admission: Yes  Orientation: Left  Access Location: Upper arm  Access Type: Arteriovenous fistula  Site Condition No complications  Fistula / Graft Assessment Present;Thrill;Bruit  Status Deaccessed     [facility]

## 2022-12-17 NOTE — Consult Note (Signed)
NEUROLOGY CONSULTATION NOTE   Date of service: December 17, 2022 Patient Name: Ruth Watkins MRN:  [id number] DOB:  04/24/1951 Reason for consult: "stroke code for slurred speech and confusion" Requesting Provider: Merrily Pew, MD _ _ _   _ __   _ __ _ _  __ __   _ __   __ _  History of Present Illness  Ruth Watkins is a 72 y.o. female with PMH significant for GERD, gout, MI, stroke, MDD, ESRD on HD who presents with confusion and slurred speech.  3AM, this AM, she fell and nephew got her up of the floor, her speech was slurred and she was reaching for stuff that was not there and not herself at all. She was at her baseline when she went to bed at 2200 last night.  She does not take her insulin like she should. She takes less insulin than she should. She does not check her glucose like she should.  Is ESRD on HD, last dialysis on Thursday and due for 1 today.  No fever, chills, no URI or UTI like symptoms. No gastroenteritis lik symptoms over the last few days.  Per EMS, enroute, glucose over 600 on their POC glucose monitor, vitals are normal.  LKW: 2200 on 12/16/22. mRS: 2 tNKASE: not offered, low suspicion for stroke. Thrombectomy: not offered, low suspicion for stroke NIHSS components Score: Comment  1a Level of Conscious 0[]  1[x]  2[]  3[]      1b LOC Questions 0[]  1[]  2[x]     Thinks its April and she is 72  1c LOC Commands 0[x]  1[]  2[]       2 Best Gaze 0[x]  1[]  2[]       3 Visual 0[x]  1[]  2[]  3[]      4 Facial Palsy 0[x]  1[]  2[]  3[]      5a Motor Arm - left 0[]  1[x]  2[]  3[]  4[]  UN[]    5b Motor Arm - Right 0[]  1[x]  2[]  3[]  4[]  UN[]    6a Motor Leg - Left 0[]  1[x]  2[]  3[]  4[]  UN[]    6b Motor Leg - Right 0[]  1[x]  2[]  3[]  4[]  UN[]    7 Limb Ataxia 0[x]  1[]  2[]  3[]  UN[]     8 Sensory 0[x]  1[]  2[]  UN[]      9 Best Language 0[x]  1[]  2[]  3[]      10 Dysarthria 0[]  1[x]  2[]  UN[]      11 Extinct. and Inattention 0[x]  1[]  2[]       TOTAL: 8     ROS   Unable to get  detailed ROS 2/2 somnolence.  Past History   Past Medical History:  Diagnosis Date   Diabetes ([place])    GERD (gastroesophageal reflux disease)    Gout    History of cerebrovascular accident    History of myocardial infarction    Ischemic stroke ([place])    Major depressive disorder    Recurrent pancreatitis    Restless leg syndrome    Steatosis of liver     No family history on file. Social History   Socioeconomic History   Marital status: Married    Spouse name: Not on file   Number of children: Not on file   Years of education: Not on file   Highest education level: Not on file  Occupational History   Not on file  Tobacco Use   Smoking status: Not on file   Smokeless tobacco: Not on file  Substance and Sexual Activity   Alcohol use: Not on file   Drug use:  Not on file   Sexual activity: Not on file  Other Topics Concern   Not on file  Social History Narrative   Not on file   Social Determinants of Health   Financial Resource Strain: Not on file  Food Insecurity: Not on file  Transportation Needs: Not on file  Physical Activity: Not on file  Stress: Not on file  Social Connections: Not on file   Not on File  Medications  (Not in a hospital admission)    Vitals   Vitals:   12/17/22 0600  Weight: 71 kg     There is no height or weight on file to calculate BMI.  Physical Exam   General: Laying comfortably in bed; in no acute distress.  HENT: Normal oropharynx and mucosa. Normal external appearance of ears and nose.  Neck: Supple, no pain or tenderness  CV: No JVD. No peripheral edema.  Pulmonary: Symmetric Chest rise. Normal respiratory effort.  Abdomen: Soft to touch, non-tender.  Ext: No cyanosis, edema, or deformity  Skin: No rash. Normal palpation of skin.   Musculoskeletal: Normal digits and nails by inspection. No clubbing.   Neurologic Examination  Mental status/Cognition: somnolent, eyes closed, opens eyes briefly to loud voice, easily  startled. Closes eyes a couple to few seconds later. With encouragement states her name. Poor attention Speech/language: Fluent, comprehension intact, object naming intact, repetition intact. Cranial nerves:   CN II Pupils equal and reactive to light, makes eye contact left and right   CN III,IV,VI EOM intact, no gaze preference or deviation, no nystagmus   CN V normal sensation in V1, V2, and V3 segments bilaterally   CN VII no asymmetry, no nasolabial fold flattening   CN VIII normal hearing to speech   CN IX & X normal palatal elevation, no uvular deviation   CN XI Head midline   CN XII midline tongue protrusion   Motor:  Muscle bulk: normal, tone normal BL upper ext slowly drift down when held up in the bed BL lower ext slowly drift down when held up of fthe bed.  Sensation:  Light touch Localizes to proximal pinch in all extremities.   Pin prick    Temperature    Vibration   Proprioception    Coordination/Complex Motor:  - Finger to Nose intact BL - Heel to shin unable to assess as it is difficult to get her to participate 2/2 somnolence. - Rapid alternating movement are slowed - Gait: deferred.  Labs   CBC: No results for input(s): "WBC", "NEUTROABS", "HGB", "HCT", "MCV", "PLT" in the last 168 hours.  Basic Metabolic Panel: No results found for: "NA", "K", "CO2", "GLUCOSE", "BUN", "CREATININE", "CALCIUM", "GFRNONAA", "GFRAA" Lipid Panel: No results found for: "LDLCALC" HgbA1c: No results found for: "HGBA1C" Urine Drug Screen: No results found for: "LABOPIA", "COCAINSCRNUR", "LABBENZ", "AMPHETMU", "THCU", "LABBARB"  Alcohol Level No results found for: "ETH"  CT Head without contrast(Personally reviewed): CTH was negative for a large hypodensity concerning for a large territory infarct or hyperdensity concerning for an ICH  Impression   Ruth Watkins is a 72 y.o. female with PMH significant for GERD, gout, MI, stroke, MDD, ESRD on HD who presents with  confusion and slurred speech. Exam with encephalopathy with no focal deficit. More concerning for metabolic/infectious process rather than a stroke, specially given noted hyperglycemia. Althou, artery of percheron stroke on the differential too.  Recommendations  - recommend infectious/metabolic workup with CBC, chemistry, Ammonia, UA, CXR, lactate, TSH, B12, folate. -  if above is non revealing, consider getting MRI Brain. - hyperglycemia management per primary team. ______________________________________________________________________  Plan discussed with Dr. Dayna Barker.  Thank you for the opportunity to take part in the care of this patient. If you have any further questions, please contact the neurology consultation attending.  Signed,  [place] Pager Number [phone] _ _ _   _ __   _ __ _ _  __ __   _ __   __ _

## 2022-12-17 NOTE — ED Provider Notes (Signed)
Patient handed off to me at 7 AM.  Came in as a code stroke but appears to have likely some encephalopathy from pneumonia may be hyperglycemia.  Patient is end-stage renal disease patient  Talked with nephrology and they will put him on the list for dialysis.  Antibiotics and sepsis workup has been initiated with the overnight team.  Blood sugars in the 500s with patient's not in DKA.  Will admit to medicine for further care.  Neurology okay without MRI at this time as it appears to be metabolic process causing her confusion.  This chart was dictated using voice recognition software.  Despite best efforts to proofread,  errors can occur which can change the documentation meaning.    Lennice Sites, DO 12/17/22 [phone]

## 2022-12-17 NOTE — Consult Note (Signed)
[facility] Renal Consultation Note    Indication for Consultation:  Management of ESRD/hemodialysis, anemia, hypertension/volume, and secondary hyperparathyroidism. PCP:  HPI: Jamarea Tigner is a 72 y.o. female with ESRD, HTN, Hx CVA, T2DM who was admitted for AMS + hyperglycemia.   Brought in to ED via EMS with concern for CVA. She fell from bed this AM around 3am and was very confused with slurred speech and hallucinations. Per notes, was in usual state of health prior to bed last night. In ED, she was slightly hypertensive, but afebrile and non-hypoxic. Labs with Na 129, K 3.4, BS 530, Alb 2.9, WBC 6.1, Hgb 9.5. Head CT negative. Neuro consulted - felt more consistent with metabolic encephalopathy. Insulin was given to correct the [facility]. CXR concerning for LLL pneumonia. Flu/COVID tests pending. Blood Cx collected and she was started on Ceftriaxone + azithromycin.  Seen in 5M13 room this AM - appears back to baseline MS. Does not recall the events of early this AM. No new meds. Admits that her DM has been very uncontrolled recently. Tells me that she has been sleeping hard over the past month because she has been taking care of her husband who has been sick. Denies fever, chills, CP, dyspnea, cough, N/V/D. She has chronic mild abdominal pain, no dysuria.  Dialyzes on TTS schedule at [facility]. Last HD was Thursday which she reports she completed. Has LUE AVF which has not given her any recent issues.  Past Medical History:  Diagnosis Date   Diabetes ([place])    GERD (gastroesophageal reflux disease)    Gout    History of cerebrovascular accident    History of myocardial infarction    Ischemic stroke ([place])    Major depressive disorder    Recurrent pancreatitis    Renal disorder    Restless leg syndrome    Steatosis of liver    Past Surgical History:  Procedure Laterality Date   CHOLECYSTECTOMY  08/25/2017   [facility]   History reviewed. No  pertinent family history. Social History:  reports that she has never smoked. She has never used smokeless tobacco. No history on file for alcohol use and drug use.  ROS: As per HPI otherwise negative.  Physical Exam: Vitals:   12/17/22 0700 12/17/22 0757 12/17/22 1031 12/17/22 1052  BP: (!) 153/62 (!) 140/69 (!) 164/65   Pulse: 64 69 70   Resp: 18 18 18    Temp:  97.7 F (36.5 C) 97.8 F (36.6 C)   TempSrc:  Oral Oral   SpO2: 92% 98% (!) 65%   Weight:    70.5 kg  Height:    5\' 2"  (1.575 m)     General: Well developed, well nourished, in no acute distress. Room air.  Head: Normocephalic, atraumatic, sclera non-icteric, mucus membranes are moist. Neck: Supple without lymphadenopathy/masses. JVD not elevated. Lungs: Clear bilaterally to auscultation without wheezes, rales, or rhonchi. Breathing is unlabored. Heart: RRR with normal S1, S2. No murmurs, rubs, or gallops appreciated. Abdomen: Soft, but mild tenderness to palpation along lower L quadrant (she reports this is chronic) Musculoskeletal:  Strength and tone appear normal for age. Lower extremities: 1+ BLE pitting edema Neuro: Alert and oriented X 3. Moves all extremities spontaneously. Psych:  Responds to questions appropriately with a normal affect. MS appears back to baseline. Dialysis Access: LUE AVF + thrill  No Known Allergies Prior to Admission medications   Not on File   Current Facility-Administered Medications  Medication Dose  Route Frequency Provider Last Rate Last Admin   acetaminophen (TYLENOL) tablet 650 mg  650 mg Oral Q6H PRN Rick Duff, MD       Or   acetaminophen (TYLENOL) suppository 650 mg  650 mg Rectal Q6H PRN Rick Duff, MD       amLODipine (NORVASC) tablet 5 mg  5 mg Oral Daily Rick Duff, MD       [START ON 12/18/2022] azithromycin (ZITHROMAX) 500 mg in sodium chloride 0.9 % 250 mL IVPB  500 mg Intravenous Q24H Rick Duff, MD       Derrill Memo ON 12/18/2022] cefTRIAXone  (ROCEPHIN) 2 g in sodium chloride 0.9 % 100 mL IVPB  2 g Intravenous Q24H Rick Duff, MD       Chlorhexidine Gluconate Cloth 2 % PADS 6 each  6 each Topical Q0600 Elmarie Shiley, MD       heparin injection 5,000 Units  5,000 Units Subcutaneous Q8H Rick Duff, MD       insulin aspart (novoLOG) injection 0-6 Units  0-6 Units Subcutaneous Q4H Rick Duff, MD   5 Units at 12/17/22 1123   insulin glargine-yfgn [facility]) injection 10 Units  10 Units Subcutaneous Daily Rick Duff, MD   10 Units at 12/17/22 1123   ondansetron (ZOFRAN) tablet 4 mg  4 mg Oral Q6H PRN Rick Duff, MD       Or   ondansetron [facility]) injection 4 mg  4 mg Intravenous Q6H PRN Rick Duff, MD   4 mg at 12/17/22 1122   Labs: Basic Metabolic Panel: Recent Labs  Lab 12/17/22 0616 12/17/22 0621 12/17/22 1015 12/17/22 1016  NA 132* 129* 131*  --   K 3.4* 3.4* 3.8  --   CL 92* 98  --   --   CO2 24  --   --   --   GLUCOSE 509* 530*  --   --   BUN 17 17  --   --   CREATININE 3.01* 3.30*  --   --   CALCIUM 8.0*  --   --   --   PHOS  --   --   --  5.3*   Liver Function Tests: Recent Labs  Lab 12/17/22 0616  AST 13*  ALT 11  ALKPHOS 82  BILITOT 0.8  PROT 5.3*  ALBUMIN 2.9*   Recent Labs  Lab 12/17/22 0723  AMMONIA 26   CBC: Recent Labs  Lab 12/17/22 0616 12/17/22 0621 12/17/22 1015  WBC 6.1  --   --   NEUTROABS 4.3  --   --   HGB 9.1* 9.5* 10.2*  HCT 29.8* 28.0* 30.0*  MCV 91.7  --   --   PLT 207  --   --    Studies/Results: DG Chest Portable 1 View  Result Date: 12/17/2022 CLINICAL DATA:  72 year old female with history of confusion and hallucination. Slurred speech. EXAM: PORTABLE CHEST 1 VIEW COMPARISON:  Chest x-ray 10/11/2022. FINDINGS: Lung volumes are low. Poorly defined opacity in the left lung base which may reflect atelectasis and/or consolidation, likely with superimposed small left pleural effusion. Interstitial prominence and peribronchial cuffing noted  throughout the mid to lower lungs bilaterally (left-greater-than-right). No pneumothorax. No evidence of pulmonary edema. Heart size is normal. Upper mediastinal contours are within normal limits. IMPRESSION: 1. Findings are concerning for probable bronchitis with left lower lobe bronchopneumonia and small left parapneumonic pleural effusion, as above. Electronically Signed   By: Vinnie Langton M.D.   On: 12/17/2022 07:01  CT HEAD CODE STROKE WO CONTRAST  Result Date: 12/17/2022 CLINICAL DATA:  Code stroke. EXAM: CT HEAD WITHOUT CONTRAST TECHNIQUE: Contiguous axial images were obtained from the base of the skull through the vertex without intravenous contrast. RADIATION DOSE REDUCTION: This exam was performed according to the departmental dose-optimization program which includes automated exposure control, adjustment of the mA and/or kV according to patient size and/or use of iterative reconstruction technique. COMPARISON:  01/12/2022 FINDINGS: Brain: No evidence of acute infarction, hemorrhage, hydrocephalus, extra-axial collection or mass lesion/mass effect. Vascular: No hyperdense vessel or unexpected calcification. Skull: Normal. Negative for fracture or focal lesion. Sinuses/Orbits: No acute finding. Other: These results were communicated to Dr. Lorrin Goodell at 6:31 am on 12/17/2022 by text page via the [facility] messaging system. ASPECTS [facility] Stroke Program Early CT Score) Not scored without specified symptoms IMPRESSION: Stable and negative head CT. Electronically Signed   By: Jorje Guild M.D.   On: 12/17/2022 06:32    Dialysis Orders:  TTS at [facility] [phone]) 3.5hr, 300/500, EDW 61kg, 2K/2.5Ca, AVF, 16g needles, no heparin (Hx GIB) - Epo 10.3K U TIW - no VDRA  Assessment/Plan:  AMS + fall: Due to hyperglycemia + pneumonia? MS clearing. Head CT negative. Her outpatient HD RN reports that she often over-takes her gabapentin -> may need to hold. Overall, she seems back to  baseline. LLL Pneumonia: On CXR - on azithro + ceftriaxone. Per primary.  ESRD:  Continue HD per usual TTS schedule - for HD today, orders placed. No heparin.  Hypertension/volume: BP slightly high and she does have LE edema - UF as tolerated.  Anemia: Hgb 10.2 - can give Aranesp here.  Metabolic bone disease: CorrCa and Phos ok - continue home meds.  Nutrition:  Alb quite low - will add protein supps. Uncontrolled T2DM: insulin per primary.  Hx CVA  Pollyann Kennedy 12/17/2022, 11:24 AM  [employer]

## 2022-12-17 NOTE — Plan of Care (Signed)

## 2022-12-18 DIAGNOSIS — N2581 Secondary hyperparathyroidism of renal origin: Secondary | ICD-10-CM | POA: Diagnosis present

## 2022-12-18 DIAGNOSIS — M109 Gout, unspecified: Secondary | ICD-10-CM | POA: Diagnosis present

## 2022-12-18 DIAGNOSIS — Z1152 Encounter for screening for COVID-19: Secondary | ICD-10-CM | POA: Diagnosis not present

## 2022-12-18 DIAGNOSIS — K760 Fatty (change of) liver, not elsewhere classified: Secondary | ICD-10-CM | POA: Diagnosis present

## 2022-12-18 DIAGNOSIS — G2581 Restless legs syndrome: Secondary | ICD-10-CM | POA: Diagnosis present

## 2022-12-18 DIAGNOSIS — D631 Anemia in chronic kidney disease: Secondary | ICD-10-CM | POA: Diagnosis present

## 2022-12-18 DIAGNOSIS — J181 Lobar pneumonia, unspecified organism: Secondary | ICD-10-CM

## 2022-12-18 DIAGNOSIS — G9341 Metabolic encephalopathy: Secondary | ICD-10-CM | POA: Diagnosis present

## 2022-12-18 DIAGNOSIS — Z79899 Other long term (current) drug therapy: Secondary | ICD-10-CM | POA: Diagnosis not present

## 2022-12-18 DIAGNOSIS — E1140 Type 2 diabetes mellitus with diabetic neuropathy, unspecified: Secondary | ICD-10-CM | POA: Diagnosis present

## 2022-12-18 DIAGNOSIS — I120 Hypertensive chronic kidney disease with stage 5 chronic kidney disease or end stage renal disease: Secondary | ICD-10-CM | POA: Diagnosis present

## 2022-12-18 DIAGNOSIS — G9340 Encephalopathy, unspecified: Secondary | ICD-10-CM | POA: Diagnosis present

## 2022-12-18 DIAGNOSIS — M898X9 Other specified disorders of bone, unspecified site: Secondary | ICD-10-CM | POA: Diagnosis present

## 2022-12-18 DIAGNOSIS — I252 Old myocardial infarction: Secondary | ICD-10-CM | POA: Diagnosis not present

## 2022-12-18 DIAGNOSIS — I2510 Atherosclerotic heart disease of native coronary artery without angina pectoris: Secondary | ICD-10-CM | POA: Diagnosis present

## 2022-12-18 DIAGNOSIS — N186 End stage renal disease: Secondary | ICD-10-CM | POA: Diagnosis present

## 2022-12-18 DIAGNOSIS — Z8673 Personal history of transient ischemic attack (TIA), and cerebral infarction without residual deficits: Secondary | ICD-10-CM | POA: Diagnosis not present

## 2022-12-18 DIAGNOSIS — K219 Gastro-esophageal reflux disease without esophagitis: Secondary | ICD-10-CM | POA: Diagnosis present

## 2022-12-18 DIAGNOSIS — Z794 Long term (current) use of insulin: Secondary | ICD-10-CM | POA: Diagnosis not present

## 2022-12-18 DIAGNOSIS — R4781 Slurred speech: Secondary | ICD-10-CM | POA: Diagnosis present

## 2022-12-18 DIAGNOSIS — Z91148 Patient's other noncompliance with medication regimen for other reason: Secondary | ICD-10-CM | POA: Diagnosis not present

## 2022-12-18 DIAGNOSIS — E1165 Type 2 diabetes mellitus with hyperglycemia: Secondary | ICD-10-CM | POA: Diagnosis present

## 2022-12-18 DIAGNOSIS — I10 Essential (primary) hypertension: Secondary | ICD-10-CM | POA: Diagnosis present

## 2022-12-18 DIAGNOSIS — G8929 Other chronic pain: Secondary | ICD-10-CM | POA: Diagnosis present

## 2022-12-18 DIAGNOSIS — Z992 Dependence on renal dialysis: Secondary | ICD-10-CM | POA: Diagnosis not present

## 2022-12-18 DIAGNOSIS — J189 Pneumonia, unspecified organism: Secondary | ICD-10-CM | POA: Diagnosis present

## 2022-12-18 DIAGNOSIS — E1122 Type 2 diabetes mellitus with diabetic chronic kidney disease: Secondary | ICD-10-CM | POA: Diagnosis present

## 2022-12-18 LAB — CBC
HCT: 30.5 % — ABNORMAL LOW (ref 36.0–46.0)
Hemoglobin: 9.5 g/dL — ABNORMAL LOW (ref 12.0–15.0)
MCH: 28.5 pg (ref 26.0–34.0)
MCHC: 31.1 g/dL (ref 30.0–36.0)
MCV: 91.6 fL (ref 80.0–100.0)
Platelets: 234 10*3/uL (ref 150–400)
RBC: 3.33 MIL/uL — ABNORMAL LOW (ref 3.87–5.11)
RDW: 15.8 % — ABNORMAL HIGH (ref 11.5–15.5)
WBC: 6.2 10*3/uL (ref 4.0–10.5)
nRBC: 0 % (ref 0.0–0.2)

## 2022-12-18 LAB — GLUCOSE, CAPILLARY
Glucose-Capillary: 127 mg/dL — ABNORMAL HIGH (ref 70–99)
Glucose-Capillary: 133 mg/dL — ABNORMAL HIGH (ref 70–99)
Glucose-Capillary: 226 mg/dL — ABNORMAL HIGH (ref 70–99)
Glucose-Capillary: 234 mg/dL — ABNORMAL HIGH (ref 70–99)
Glucose-Capillary: 242 mg/dL — ABNORMAL HIGH (ref 70–99)
Glucose-Capillary: 288 mg/dL — ABNORMAL HIGH (ref 70–99)

## 2022-12-18 LAB — FERRITIN: Ferritin: 191 ng/mL (ref 11–307)

## 2022-12-18 LAB — IRON AND TIBC
Iron: 27 ug/dL — ABNORMAL LOW (ref 28–170)
Saturation Ratios: 14 % (ref 10.4–31.8)
TIBC: 192 ug/dL — ABNORMAL LOW (ref 250–450)
UIBC: 165 ug/dL

## 2022-12-18 LAB — BASIC METABOLIC PANEL
Anion gap: 9 (ref 5–15)
BUN: 10 mg/dL (ref 8–23)
CO2: 29 mmol/L (ref 22–32)
Calcium: 7.9 mg/dL — ABNORMAL LOW (ref 8.9–10.3)
Chloride: 97 mmol/L — ABNORMAL LOW (ref 98–111)
Creatinine, Ser: 2.16 mg/dL — ABNORMAL HIGH (ref 0.44–1.00)
GFR, Estimated: 24 mL/min — ABNORMAL LOW (ref >60)
Glucose, Bld: 148 mg/dL — ABNORMAL HIGH (ref 70–99)
Potassium: 3.9 mmol/L (ref 3.5–5.1)
Sodium: 135 mmol/L (ref 135–145)

## 2022-12-18 LAB — VITAMIN B12: Vitamin B-12: 552 pg/mL (ref 180–914)

## 2022-12-18 LAB — FOLATE: Folate: 8.3 ng/mL (ref >5.9)

## 2022-12-18 MED ORDER — GABAPENTIN 100 MG PO CAPS
100.0000 mg | ORAL_CAPSULE | Freq: Two times a day (BID) | ORAL | Status: DC
  Administered 2022-12-18 – 2022-12-19 (×3): 100 mg via ORAL
  Filled 2022-12-18 (×3): qty 1

## 2022-12-18 MED ORDER — AMOXICILLIN-POT CLAVULANATE 500-125 MG PO TABS
1.0000 | ORAL_TABLET | Freq: Every day | ORAL | Status: DC
  Administered 2022-12-19: 1 via ORAL
  Filled 2022-12-18: qty 1

## 2022-12-18 MED ORDER — DARBEPOETIN ALFA 60 MCG/0.3ML IJ SOSY
60.0000 ug | PREFILLED_SYRINGE | INTRAMUSCULAR | Status: DC
  Administered 2022-12-18: 60 ug via SUBCUTANEOUS
  Filled 2022-12-18: qty 0.3

## 2022-12-18 MED ORDER — ROPINIROLE HCL 1 MG PO TABS
2.0000 mg | ORAL_TABLET | Freq: Every day | ORAL | Status: DC
  Administered 2022-12-18: 2 mg via ORAL
  Filled 2022-12-18: qty 2

## 2022-12-18 MED ORDER — INSULIN ASPART 100 UNIT/ML IJ SOLN
0.0000 [IU] | Freq: Three times a day (TID) | INTRAMUSCULAR | Status: DC
  Administered 2022-12-19: 4 [IU] via SUBCUTANEOUS
  Administered 2022-12-19: 1 [IU] via SUBCUTANEOUS

## 2022-12-18 MED ORDER — GABAPENTIN 100 MG PO CAPS: 100.0000 mg | ORAL_CAPSULE | Freq: Three times a day (TID) | ORAL | Status: DC

## 2022-12-18 MED ORDER — AZITHROMYCIN 500 MG PO TABS
500.0000 mg | ORAL_TABLET | Freq: Every day | ORAL | Status: AC
  Administered 2022-12-19: 500 mg via ORAL
  Filled 2022-12-18: qty 1

## 2022-12-18 MED ORDER — PROSOURCE PLUS PO LIQD
30.0000 mL | Freq: Two times a day (BID) | ORAL | Status: DC
  Administered 2022-12-18 – 2022-12-19 (×4): 30 mL via ORAL
  Filled 2022-12-18 (×4): qty 30

## 2022-12-18 NOTE — Evaluation (Addendum)
Occupational Therapy Evaluation Patient Details Name: Ruth Watkins MRN: [id number] DOB: 03/09/1951 Today's Date: 12/18/2022   History of Present Illness Pt is a 72 y.o. female presenting after fall at home. Family reports AMS, hallucinations, and slurred speech after assisting her up from floor. CT negative. Admitted for acute metabolic encephalopathy. CXR concerning for pneumonia. Hyperglycemic on presentation. PMH significant for ESRD on dialysis, DMII poorly controlled, cerebrovascular disease, CAD.   Clinical Impression   PTA, pt lived with her husband and was mod I in all ADL ad IADL with exception of daughter provides set-up for pill box each week. Upon eval, pt presents with decresaed STM and problem solving. Pt scored a 6 on the Short Blessed Test of cognitive function indicating questionable impairment. Able to answer basic questions regarding pill box use for medication management and for financial management. Will continue to assess executive function. Recommending OT services in pt's natural context to optimize safety and independence in ADL and IADL.      Recommendations for follow up therapy are one component of a multi-disciplinary discharge planning process, led by the attending physician.  Recommendations may be updated based on patient status, additional functional criteria and insurance authorization.   Assistance Recommended at Discharge Intermittent Supervision/Assistance  Patient can return home with the following Direct supervision/assist for medications management;Direct supervision/assist for financial management;Assist for transportation    Functional Status Assessment  Patient has had a recent decline in their functional status and demonstrates the ability to make significant improvements in function in a reasonable and predictable amount of time.  Equipment Recommendations  BSC/3in1    Recommendations for Other Services       Precautions /  Restrictions Precautions Precautions: Fall Restrictions Weight Bearing Restrictions: No      Mobility Bed Mobility               General bed mobility comments: in recliner on arrival and departure    Transfers Overall transfer level: Needs assistance Equipment used: Rolling walker (2 wheels) Transfers: Sit to/from Stand, Bed to chair/wheelchair/BSC Sit to Stand: Modified independent (Device/Increase time)     Step pivot transfers: Supervision     General transfer comment: supervision with steps for safety, approaching mod I      Balance Overall balance assessment: Mild deficits observed, not formally tested                                         ADL either performed or assessed with clinical judgement   ADL Overall ADL's : Needs assistance/impaired Eating/Feeding: Independent   Grooming: Supervision/safety;Standing   Upper Body Bathing: Set up;Sitting   Lower Body Bathing: Sit to/from stand;Modified independent   Upper Body Dressing : Set up;Sitting   Lower Body Dressing: Sit to/from stand;Modified independent   Toilet Transfer: Supervision/safety;Ambulation;Rolling walker (2 wheels)       Tub/ Shower Transfer: Tub transfer;Min guard;Ambulation;Rolling walker (2 wheels)   Functional mobility during ADLs: Supervision/safety;Rolling walker (2 wheels) General ADL Comments: mildl;y unsteady     Vision Baseline Vision/History: 1 Wears glasses Ability to See in Adequate Light: 0 Adequate Patient Visual Report: No change from baseline Vision Assessment?: No apparent visual deficits Additional Comments: able to read OT name badge     Perception     Praxis      Pertinent Vitals/Pain Pain Assessment Pain Assessment: Faces Faces Pain Scale: No hurt Pain Intervention(s): Monitored  during session     Hand Dominance Right   Extremity/Trunk Assessment Upper Extremity Assessment Upper Extremity Assessment: Overall WFL for tasks  assessed (4+/5 overall)   Lower Extremity Assessment Lower Extremity Assessment: Defer to PT evaluation       Communication Communication Communication: No difficulties   Cognition Arousal/Alertness: Awake/alert Behavior During Therapy: WFL for tasks assessed/performed Overall Cognitive Status: Impaired/Different from baseline Area of Impairment: Memory                     Memory: Decreased short-term memory         General Comments: Pt scoring a 6 on the short blessed test of cognition indicating questionable cognitive impairment. Pt with difficulty with delayed memory recall and stating months of the year in the reverse order. Pt with good recall of how her pill box is utilized at home, good use of compensatory techniques for managing appointments, and able to perofrm simple money management during session     General Comments  VSS. daughter able to help intermittently, but works 12 hour shifts    Exercises     Shoulder [place] expects to be discharged to:: Private residence Living Arrangements: Spouse/significant other Available Help at Discharge: Family;Available PRN/intermittently (daughter lives behind them but works; spouse unable to assist) Type of Home: Mobile home Home Access: Ramped entrance     Home Layout: One level     Bathroom Shower/Tub: Teacher, early years/pre: Standard     Home Equipment: Rollator (4 wheels)          Prior Functioning/Environment Prior Level of Function : Independent/Modified Independent;Driving             Mobility Comments: no AD ADLs Comments: performs her own grocery shopping, home management, and ADL. Daughter provides set-up A for pill box, and husband normally perform financial management.        OT Problem List: Decreased activity tolerance;Impaired balance (sitting and/or standing);Decreased strength;Decreased cognition;Decreased knowledge of use of DME  or AE      OT Treatment/Interventions: Self-care/ADL training;Therapeutic exercise;Balance training;Patient/family education;Cognitive remediation/compensation;Therapeutic activities;DME and/or AE instruction    OT Goals(Current goals can be found in the care plan section) Acute Rehab OT Goals Patient Stated Goal: go home OT Goal Formulation: With patient Time For Goal Achievement: 01/01/23 Potential to Achieve Goals: Good  OT Frequency: Min 2X/week    Co-evaluation              AM-PAC OT "6 Clicks" Daily Activity     Outcome Measure Help from another person eating meals?: None Help from another person taking care of personal grooming?: A Little Help from another person toileting, which includes using toliet, bedpan, or urinal?: A Little Help from another person bathing (including washing, rinsing, drying)?: A Little Help from another person to put on and taking off regular upper body clothing?: A Little Help from another person to put on and taking off regular lower body clothing?: A Little 6 Click Score: 19   End of Session Equipment Utilized During Treatment: Gait belt;Rolling walker (2 wheels) Nurse Communication: Mobility status  Activity Tolerance: Patient tolerated treatment well Patient left: in chair;with call bell/phone within reach;with chair alarm set  OT Visit Diagnosis: Unsteadiness on feet (R26.81);Other symptoms and signs involving cognitive function                Time: [id number] OT Time Calculation (min): 20 min Charges:  OT General  Charges $OT Visit: 1 Visit OT Evaluation $OT Eval Low Complexity: 1 Low  Elder Cyphers, OTR/L [facility] Acute Rehabilitation Office: [phone]   Magnus Ivan 12/18/2022, 1:53 PM

## 2022-12-18 NOTE — Progress Notes (Signed)
[facility] Progress Note   Subjective:  Seen in room - did ok overnight and dialysis went fine. No further confusion, speech is clear. BS improved this AM. Getting IV abx for pneumonia. No CP/dyspnea.  Objective Vitals:   12/17/22 1819 12/17/22 1919 12/18/22 0425 12/18/22 0819  BP: (!) 130/115 (!) 153/69 (!) 123/52 (!) 123/50  Pulse: 67 71 69 74  Resp: 17 18 18 18   Temp: 97.9 F (36.6 C) 98.3 F (36.8 C) 98.5 F (36.9 C) 99.3 F (37.4 C)  TempSrc: Oral Oral Oral Oral  SpO2: 98% 96% 96% 96%  Weight:      Height:       Physical Exam General: Well appearing woman, NAD. Room air. Heart: RRR; no murmur Lungs: CTAB Abdomen: soft Extremities: no LE edema Dialysis Access:  LUE AVF + bruit  Additional Objective Labs: Basic Metabolic Panel: Recent Labs  Lab 12/17/22 0616 12/17/22 0621 12/17/22 1015 12/17/22 1016 12/18/22 0407  NA 132* 129* 131*  --  135  K 3.4* 3.4* 3.8  --  3.9  CL 92* 98  --   --  97*  CO2 24  --   --   --  29  GLUCOSE 509* 530*  --   --  148*  BUN 17 17  --   --  10  CREATININE 3.01* 3.30*  --   --  2.16*  CALCIUM 8.0*  --   --   --  7.9*  PHOS  --   --   --  5.3*  --    Liver Function Tests: Recent Labs  Lab 12/17/22 0616  AST 13*  ALT 11  ALKPHOS 82  BILITOT 0.8  PROT 5.3*  ALBUMIN 2.9*   CBC: Recent Labs  Lab 12/17/22 0616 12/17/22 0621 12/17/22 1015 12/18/22 0407  WBC 6.1  --   --  6.2  NEUTROABS 4.3  --   --   --   HGB 9.1* 9.5* 10.2* 9.5*  HCT 29.8* 28.0* 30.0* 30.5*  MCV 91.7  --   --  91.6  PLT 207  --   --  234   CBG: Recent Labs  Lab 12/17/22 1923 12/17/22 2030 12/18/22 0004 12/18/22 0426 12/18/22 0730  GLUCAP 133* 139* 226* 127* 133*   Iron Studies:  Recent Labs    12/18/22 0407  IRON 27*  TIBC 192*  FERRITIN 191   Studies/Results: DG Chest Portable 1 View  Result Date: 12/17/2022 CLINICAL DATA:  72 year old female with history of confusion and hallucination. Slurred speech. EXAM:  PORTABLE CHEST 1 VIEW COMPARISON:  Chest x-ray 10/11/2022. FINDINGS: Lung volumes are low. Poorly defined opacity in the left lung base which may reflect atelectasis and/or consolidation, likely with superimposed small left pleural effusion. Interstitial prominence and peribronchial cuffing noted throughout the mid to lower lungs bilaterally (left-greater-than-right). No pneumothorax. No evidence of pulmonary edema. Heart size is normal. Upper mediastinal contours are within normal limits. IMPRESSION: 1. Findings are concerning for probable bronchitis with left lower lobe bronchopneumonia and small left parapneumonic pleural effusion, as above. Electronically Signed   By: Vinnie Langton M.D.   On: 12/17/2022 07:01   CT HEAD CODE STROKE WO CONTRAST  Result Date: 12/17/2022 CLINICAL DATA:  Code stroke. EXAM: CT HEAD WITHOUT CONTRAST TECHNIQUE: Contiguous axial images were obtained from the base of the skull through the vertex without intravenous contrast. RADIATION DOSE REDUCTION: This exam was performed according to the departmental dose-optimization program which includes automated exposure control, adjustment of  the mA and/or kV according to patient size and/or use of iterative reconstruction technique. COMPARISON:  01/12/2022 FINDINGS: Brain: No evidence of acute infarction, hemorrhage, hydrocephalus, extra-axial collection or mass lesion/mass effect. Vascular: No hyperdense vessel or unexpected calcification. Skull: Normal. Negative for fracture or focal lesion. Sinuses/Orbits: No acute finding. Other: These results were communicated to Dr. Lorrin Goodell at 6:31 am on 12/17/2022 by text page via the [facility] messaging system. ASPECTS [facility] Stroke Program Early CT Score) Not scored without specified symptoms IMPRESSION: Stable and negative head CT. Electronically Signed   By: Jorje Guild M.D.   On: 12/17/2022 06:32    Medications:  azithromycin (ZITHROMAX) 500 mg in sodium chloride 0.9 % 250 mL IVPB      cefTRIAXone (ROCEPHIN)  IV 2 g (12/18/22 0835)    amLODipine  5 mg Oral Daily   aspirin EC  81 mg Oral Daily   heparin  5,000 Units Subcutaneous Q8H   insulin aspart  0-6 Units Subcutaneous Q4H   insulin glargine-yfgn  10 Units Subcutaneous Daily    Dialysis Orders: TTS at [facility] [phone]) 3.5hr, 300/500, EDW 61kg, 2K/2.5Ca, AVF, 16g needles, no heparin (Hx GIB) - Epo 10.3K U TIW - no VDRA   Assessment/Plan:  AMS + fall: Due to hyperglycemia + pneumonia? Head CT negative. Her outpatient HD RN reports that she often over-takes her gabapentin -> may need to hold. Overall, she seems back to baseline mentally.  LLL Pneumonia: On CXR - on azithro + ceftriaxone. Per primary.  ESRD:  Continue HD per usual TTS schedule - next 4/2.  Hypertension/volume: BP stable, edema improving. Still way up per bed weights, may be inaccurate.  Anemia: Hgb 9.5 -  will order Aranesp to be given today.  Metabolic bone disease: CorrCa and Phos ok - continue home meds.  Nutrition:  Alb quite low - will add protein supps. Uncontrolled T2DM: insulin per primary - better today.  Hx CVA  Pollyann Kennedy 12/18/2022, 8:52 AM  [employer]

## 2022-12-18 NOTE — Evaluation (Signed)
Physical Therapy Evaluation Patient Details Name: Ruth Watkins MRN: [id number] DOB: 03/20/1951 Today's Date: 12/18/2022  History of Present Illness  Pt is a 72 y.o. female presenting after fall at home. Family reports AMS, hallucinations, and slurred speech after assisting her up from floor. CT negative. Admitted for acute metabolic encephalopathy. CXR concerning for pneumonia. Hyperglycemic on presentation. PMH significant for ESRD on dialysis, DMII poorly controlled, cerebrovascular disease, CAD.  Clinical Impression  Pt admitted with above diagnosis. Pt was able to ambulate with RW with overall good stability. Pt has a rollator at home per pt and encouraged pt to use it at all times on d/c and pt agrees. Pt also would benefit from HHPT.  Messaged CM regarding options for A living as pt is interested in information to see if she and husband qualify as they care for each other. Pt reports that it gets difficult for them at times.  Pt currently with functional limitations due to the deficits listed below (see PT Problem List). Pt will benefit from acute skilled PT to increase their independence and safety with mobility to allow discharge.         Recommendations for follow up therapy are one component of a multi-disciplinary discharge planning process, led by the attending physician.  Recommendations may be updated based on patient status, additional functional criteria and insurance authorization.  Follow Up Recommendations       Assistance Recommended at Discharge Intermittent Supervision/Assistance  Patient can return home with the following  A little help with walking and/or transfers;Assistance with cooking/housework;Assist for transportation;Help with stairs or ramp for entrance    Equipment Recommendations BSC/3in1  Recommendations for Other Services       Functional Status Assessment Patient has had a recent decline in their functional status and demonstrates the ability  to make significant improvements in function in a reasonable and predictable amount of time.     Precautions / Restrictions Precautions Precautions: Fall Restrictions Weight Bearing Restrictions: No      Mobility  Bed Mobility Overal bed mobility: Needs Assistance Bed Mobility: Supine to Sit     Supine to sit: Independent          Transfers Overall transfer level: Needs assistance Equipment used: Rolling walker (2 wheels) Transfers: Sit to/from Stand, Bed to chair/wheelchair/BSC Sit to Stand: Supervision           General transfer comment: No assist to stand to RW from bed.  Did need to use grab bar to stand from low toilet. Pt was able to clean herself.    Ambulation/Gait Ambulation/Gait assistance: Supervision Gait Distance (Feet): 250 Feet Assistive device: Rolling walker (2 wheels) Gait Pattern/deviations: Step-through pattern, Decreased stride length, Trunk flexed   Gait velocity interpretation: <1.31 ft/sec, indicative of household ambulator   General Gait Details: Pt did well with ambulation overall with a few lateral leans to left with ambulation which pt self corrected without LOB.  Pt states she has a Rollator at home and will use if for safety.  Was not using rollator PTA.  Stairs            Wheelchair Mobility    Modified Rankin (Stroke Patients Only)       Balance Overall balance assessment: Mild deficits observed, not formally tested  Pertinent Vitals/Pain Pain Assessment Pain Assessment: No/denies pain Faces Pain Scale: No hurt    Home Living Family/patient expects to be discharged to:: Private residence Living Arrangements: Spouse/significant other Available Help at Discharge: Family;Available PRN/intermittently (daughter lives behind them but works; spouse unable to assist) Type of Home: Mobile home Home Access: Ramped entrance       Home Layout: One level Home  Equipment: Rollator (4 wheels)      Prior Function Prior Level of Function : Independent/Modified Independent;Driving             Mobility Comments: no AD ADLs Comments: performs her own grocery shopping, home management, and ADL. Daughter provides set-up A for pill box, and husband normally perform financial management.     Hand Dominance   Dominant Hand: Right    Extremity/Trunk Assessment   Upper Extremity Assessment Upper Extremity Assessment: Defer to OT evaluation    Lower Extremity Assessment Lower Extremity Assessment: Generalized weakness       Communication   Communication: No difficulties  Cognition Arousal/Alertness: Awake/alert Behavior During Therapy: WFL for tasks assessed/performed Overall Cognitive Status: Impaired/Different from baseline Area of Impairment: Memory                     Memory: Decreased short-term memory                  General Comments General comments (skin integrity, edema, etc.): VSS    Exercises     Assessment/Plan    PT Assessment Patient needs continued PT services  PT Problem List Decreased activity tolerance;Decreased balance;Decreased mobility;Decreased knowledge of use of DME;Decreased safety awareness;Decreased knowledge of precautions       PT Treatment Interventions DME instruction;Gait training;Functional mobility training;Therapeutic activities;Therapeutic exercise;Balance training;Patient/family education    PT Goals (Current goals can be found in the Care Plan section)  Acute Rehab PT Goals Patient Stated Goal: to go home PT Goal Formulation: With patient Time For Goal Achievement: 01/01/23 Potential to Achieve Goals: Good    Frequency Min 3X/week     Co-evaluation               AM-PAC PT "6 Clicks" Mobility  Outcome Measure Help needed turning from your back to your side while in a flat bed without using bedrails?: None Help needed moving from lying on your back to sitting  on the side of a flat bed without using bedrails?: None Help needed moving to and from a bed to a chair (including a wheelchair)?: A Little Help needed standing up from a chair using your arms (e.g., wheelchair or bedside chair)?: A Little Help needed to walk in hospital room?: A Little Help needed climbing 3-5 steps with a railing? : A Lot 6 Click Score: 19    End of Session Equipment Utilized During Treatment: Gait belt Activity Tolerance: Patient limited by fatigue Patient left: in chair;with call bell/phone within reach;with chair alarm set Nurse Communication: Mobility status PT Visit Diagnosis: Muscle weakness (generalized) (M62.81)    Time: [id number] PT Time Calculation (min) (ACUTE ONLY): 30 min   Charges:   PT Evaluation $PT Eval Moderate Complexity: 1 Mod PT Treatments $Gait Training: 8-22 mins        [facility] M,PT Acute Rehab Services [phone]   Alvira Philips 12/18/2022, 2:17 PM

## 2022-12-18 NOTE — Progress Notes (Addendum)
HD#0 Subjective:   Summary: Ruth Watkins is a 72 y.o. 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  Overnight Events: None  Patient is awake and alert this morning. She states she is feeling well. Oriented x 4 to self, place, time and situation. States she was "out of it" yesterday but now it has resolved. She is caring for her husband who has been declining. Daughter lives nearby but not here today due to [place].  Objective:  Vital signs in last 24 hours: Vitals:   12/17/22 1817 12/17/22 1819 12/17/22 1919 12/18/22 0425  BP:  (!) 130/115 (!) 153/69 (!) 123/52  Pulse:  67 71 69  Resp:  17 18 18   Temp:  97.9 F (36.6 C) 98.3 F (36.8 C) 98.5 F (36.9 C)  TempSrc:  Oral Oral Oral  SpO2:  98% 96% 96%  Weight: 70.8 kg     Height:       Supplemental O2: Room Air SpO2: 96 %   Physical Exam:  Constitutional: Awake, laying in bed comfortably, in no acute distress HENT: normocephalic atraumatic Neck: supple Cardiovascular: regular rate and rhythm Pulmonary/Chest: normal work of breathing on room air, lungs mostly clear to auscultation with minimal basilar crackles, no wheezing  Abdominal: bowel sounds present, soft, non-tender, non-distended MSK: normal bulk and tone Neurological: alert & oriented x 4 to person, place, time and situation Skin: warm and dry Psych: pleasant mood  Filed Weights   12/17/22 1052 12/17/22 1443 12/17/22 1817  Weight: 70.5 kg 72.3 kg 70.8 kg     Intake/Output Summary (Last 24 hours) at 12/18/2022 0622 Last data filed at 12/18/2022 0004 Gross per 24 hour  Intake 237 ml  Output 1500 ml  Net -1263 ml   Net IO Since Admission: -1,263 mL [12/18/22 0622]  Pertinent Labs:    Latest Ref Rng & Units 12/18/2022    4:07 AM 12/17/2022   10:15 AM 12/17/2022    6:21 AM  CBC   WBC 4.0 - 10.5 K/uL 6.2     Hemoglobin 12.0 - 15.0 g/dL 9.5  10.2  9.5   Hematocrit 36.0 - 46.0 % 30.5  30.0  28.0   Platelets 150 - 400 K/uL 234          Latest Ref Rng & Units 12/18/2022    4:07 AM 12/17/2022   10:15 AM 12/17/2022    6:21 AM  CMP  Glucose 70 - 99 mg/dL 148   530   BUN 8 - 23 mg/dL 10   17   Creatinine 0.44 - 1.00 mg/dL 2.16   3.30   Sodium 135 - 145 mmol/L 135  131  129   Potassium 3.5 - 5.1 mmol/L 3.9  3.8  3.4   Chloride 98 - 111 mmol/L 97   98   CO2 22 - 32 mmol/L 29     Calcium 8.9 - 10.3 mg/dL 7.9       Imaging: DG Chest Portable 1 View  Result Date: 12/17/2022 CLINICAL DATA:  72 year old female with history of confusion and hallucination. Slurred speech. EXAM: PORTABLE CHEST 1 VIEW COMPARISON:  Chest x-ray 10/11/2022. FINDINGS: Lung volumes are low. Poorly defined opacity in the left lung base which may reflect atelectasis and/or consolidation, likely with superimposed small left pleural effusion. Interstitial prominence and peribronchial  cuffing noted throughout the mid to lower lungs bilaterally (left-greater-than-right). No pneumothorax. No evidence of pulmonary edema. Heart size is normal. Upper mediastinal contours are within normal limits. IMPRESSION: 1. Findings are concerning for probable bronchitis with left lower lobe bronchopneumonia and small left parapneumonic pleural effusion, as above. Electronically Signed   By: Vinnie Langton M.D.   On: 12/17/2022 07:01   CT HEAD CODE STROKE WO CONTRAST  Result Date: 12/17/2022 CLINICAL DATA:  Code stroke. EXAM: CT HEAD WITHOUT CONTRAST TECHNIQUE: Contiguous axial images were obtained from the base of the skull through the vertex without intravenous contrast. RADIATION DOSE REDUCTION: This exam was performed according to the departmental dose-optimization program which includes automated exposure control, adjustment of the mA and/or kV according to patient size and/or use of iterative reconstruction technique.  COMPARISON:  01/12/2022 FINDINGS: Brain: No evidence of acute infarction, hemorrhage, hydrocephalus, extra-axial collection or mass lesion/mass effect. Vascular: No hyperdense vessel or unexpected calcification. Skull: Normal. Negative for fracture or focal lesion. Sinuses/Orbits: No acute finding. Other: These results were communicated to Dr. Lorrin Goodell at 6:31 am on 12/17/2022 by text page via the [facility] messaging system. ASPECTS [facility] Stroke Program Early CT Score) Not scored without specified symptoms IMPRESSION: Stable and negative head CT. Electronically Signed   By: Jorje Guild M.D.   On: 12/17/2022 06:32    Assessment/Plan:   Principal Problem:   Encephalopathy acute   Patient Summary: Ruth Watkins is a 72 y.o. 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  #Acute metabolic encephalopathy #LLL PNA CT head negative for acute findings.  Neurology initially consulted, no MRI at this time unless remains altered, recommends workup for other etiologies for encephalopathy.  CXR showed LLL opacity concerning for pnuemonia.  Procalcitonin at 0.67.  RVP negative.  Continue CAP coverage.  She also presented hyperglycemic with uncontrolled T2DM. Mentation has improved and is alert and oriented x4. 3/30 Bcx NGTD. Patient on gabapentin for RLS, concern that patient may be over taking it per nephrology note. Holding gabapentin for now.  -transition to oral antibiotics, will stop rocephin (received 2 doses) -continue Azithromycin 500 mg daily (day 2/3)  -start Augmentin 500-125 mg daily (3 doses for total 5 day course) -continue glycemic control  -f/u Bcx -hold home gabapentin for now -PT and OT eval   #T2DM with hyperglycemia A1c was 11.2 in January. Home meds include glargine 20 units nightly and sliding scale with meals. Glucose ~500  on arrival. On Semglee 10 units daily and SSI.  Fasting glucose 133 today. If CBGs elevated, can increase long acting back to home dosing.  -CBG monitoring -continue semglee 10 units daily -SSI-VS  #ESRD on HD TThS Nephrology following. Received HD yesterday. Next HD would be 4/2.  -appreciate nephrology assistance   #Hx of CVA (2021) Was on DAPT but held in February in setting of GIB.  -continue home ASA 81 mg daily  #HTN BP normotensive and at times mildly elevated. Restarted her home amlodipine 5 mg daily.  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 Hgb has been stable during this admission. B12 and folate normal. Ferritin normal and mildly low iron. Likely in setting of anemia of CKD.  -monitor for signs of bleeding  -trend CBC  #Severe RLS -held home  Requip qhs and gabapentin in setting of AMS  Diet: Carb-Modified IVF: None,None VTE: Heparin Code: Full PT/OT recs: Pending Family Update: no family at bedside this AM, will call daughter today   Dispo: Anticipated discharge to  TBD  pending medical stability and PT/OT eval.   Angelique Blonder, DO Internal Medicine Resident PGY-1 Pager: [phone] Please contact the on call pager after 5 pm and on weekends at [phone].

## 2022-12-18 NOTE — Progress Notes (Signed)
Neurology Progress Note  Brief HPI: 72 y.o. female with PMH significant for GERD, gout, MI, stroke, MDD, ESRD on HD T TH S who presented as a code stroke with confusion and slurred speech on 3/30. She initially fell around 3am and her nephew found her to have slurred speech and altered mental status. On EMS arrival her glucose was over 600. Initial NIH was 8. CT head negative for acute abnormality. CXR was concerning for PNA and presentation was consistent with a metabolic encephalopathy.  Subjective: Oriented, does not remember yesterdays events. States she is tired but feeling better. She does typically get tired after dialysis. No focal deficits on neurological exam today.   Exam: Vitals:   12/18/22 0425 12/18/22 0819  BP: (!) 123/52 (!) 123/50  Pulse: 69 74  Resp: 18 18  Temp: 98.5 F (36.9 C) 99.3 F (37.4 C)  SpO2: 96% 96%   Gen: In bed, NAD Resp: non-labored breathing, no acute distress Abd: soft, nt No lower extremity edema noted  Neuro: Mental Status: Aaox4. Knows she is in the hospital, but does state [facility].  Speech is clear. Patient is able to give a clear and coherent history. Conversation is appropriate No signs of aphasia or neglect Cranial Nerves: II: Visual Fields are full. PERRL.   III,IV, VI: EOMI without ptosis or diploplia.  V: Facial sensation is symmetric to temperature VII: Facial movement is symmetric resting and smiling VIII: Hearing is intact to voice X: Palate elevates symmetrically XI: Shoulder shrug is symmetric. XII: Tongue protrudes midline without atrophy or fasciculations.  Motor: Tone is normal. Bulk is normal. 5/5 strength was present in all four extremities.  Sensory: Sensation is symmetric to light touch  Cerebellar: FNF and HKS are intact bilaterally, rapid alternating movements Gait: steady or deferred for safety    Pertinent Labs: Cr 2.16 BNP 2006  Imaging Reviewed: Head CT- Stable and negative head CT.    Assessment: 72 y.o. female with PMH significant for GERD, gout, MI, stroke, MDD, ESRD on HD T TH S who presented as a code stroke with confusion and slurred speech on 3/30. She initially fell around 3am and her nephew found her to have slurred speech and altered mental status. On EMS arrival her glucose was over 600. Initial NIH was 8. CT head negative for acute abnormality. CXR was concerning for PNA and presentation was consistent with a metabolic encephalopathy. Neurology will remain available as needed. Please call with questions   Recommendations: - Continued metabolic work up per primary team - Presentation not consistent with stroke  - Follow up with PCP on discharge   Patient seen and examined by NP/APP with MD. MD to update note as needed.   Janine Ores, DNP, FNP-BC [facility] Pager: [phone]

## 2022-12-18 NOTE — Plan of Care (Signed)

## 2022-12-18 NOTE — Progress Notes (Signed)
Pt. Ambulated length of hallway on R/A maintaining O2 sat of 96-98 %, no distress noted  [name]

## 2022-12-18 NOTE — Plan of Care (Signed)
  Problem: Metabolic: Goal: Ability to maintain appropriate glucose levels will improve Outcome: Not Progressing   Problem: Tissue Perfusion: Goal: Adequacy of tissue perfusion will improve Outcome: Not Progressing

## 2022-12-19 ENCOUNTER — Other Ambulatory Visit (HOSPITAL_COMMUNITY): Payer: Self-pay

## 2022-12-19 LAB — URINALYSIS, ROUTINE W REFLEX MICROSCOPIC
Bilirubin Urine: NEGATIVE
Glucose, UA: >=500 mg/dL — AB
Hgb urine dipstick: NEGATIVE
Ketones, ur: NEGATIVE mg/dL
Nitrite: NEGATIVE
Protein, ur: >=300 mg/dL — AB
Specific Gravity, Urine: 1.017 (ref 1.005–1.030)
pH: 6 (ref 5.0–8.0)

## 2022-12-19 LAB — GLUCOSE, CAPILLARY
Glucose-Capillary: 199 mg/dL — ABNORMAL HIGH (ref 70–99)
Glucose-Capillary: 348 mg/dL — ABNORMAL HIGH (ref 70–99)
Glucose-Capillary: 329 mg/dL — ABNORMAL HIGH (ref 70–99)

## 2022-12-19 LAB — RAPID URINE DRUG SCREEN, HOSP PERFORMED
Amphetamines: NOT DETECTED
Barbiturates: NOT DETECTED
Benzodiazepines: NOT DETECTED
Cocaine: NOT DETECTED
Opiates: NOT DETECTED
Tetrahydrocannabinol: NOT DETECTED

## 2022-12-19 MED ORDER — DOXYCYCLINE HYCLATE 100 MG PO TABS
100.0000 mg | ORAL_TABLET | Freq: Two times a day (BID) | ORAL | 0 refills | Status: AC
  Filled 2022-12-19: qty 4, 2d supply, fill #0

## 2022-12-19 MED ORDER — ASPIRIN 81 MG PO TBEC
81.0000 mg | DELAYED_RELEASE_TABLET | Freq: Every day | ORAL | 0 refills | Status: AC
  Filled 2022-12-19: qty 30, 30d supply, fill #0

## 2022-12-19 MED ORDER — DOXYCYCLINE HYCLATE 100 MG PO TABS: 100.0000 mg | ORAL_TABLET | Freq: Two times a day (BID) | ORAL | Status: DC

## 2022-12-19 MED ORDER — GABAPENTIN 100 MG PO CAPS
200.0000 mg | ORAL_CAPSULE | Freq: Once | ORAL | Status: AC
  Administered 2022-12-19: 200 mg via ORAL
  Filled 2022-12-19: qty 2

## 2022-12-19 MED ORDER — MELATONIN 3 MG PO TABS
3.0000 mg | ORAL_TABLET | Freq: Every day | ORAL | Status: DC
  Administered 2022-12-19: 3 mg via ORAL
  Filled 2022-12-19: qty 1

## 2022-12-19 MED ORDER — INSULIN GLARGINE-YFGN 100 UNIT/ML ~~LOC~~ SOLN
20.0000 [IU] | Freq: Every day | SUBCUTANEOUS | Status: DC
  Administered 2022-12-19: 20 [IU] via SUBCUTANEOUS
  Filled 2022-12-19: qty 0.2

## 2022-12-19 NOTE — Progress Notes (Signed)
Subjective: Paged by nursing staff for hand/foot pain. Our team went to reassess the patient. Upon arrival, patient was resting in bed.  Patient reports burning pain in bilateral feet and hands.  Patient states this is consistent with her history of diabetic neuropathy.  Reports that she is having difficulty sleeping due to this pain.  She reports that her pain is typically well-controlled with the gabapentin at home.  Reports taking 600 mg of gabapentin twice daily at home.  Discussed that she was admitted for confusion, and therefore, we are minimizing sedating medications at this time.  Given her improvement since admission, it is possible that polypharmacy may have been contributing to her confusion.  Objective: Constitutional: resting in bed, pleasant, appears mildly uncomfortable   Musculoskeletal: Normal range of motion.     Neurological: Alert and oriented to person, place, and time. Non-focal. Skin: warm and dry.   Plan: Given her history of poorly controlled type 2 diabetes, prior history of neuropathy, and description of her pain, I am convinced that her pain is likely neuropathic.  Given that she was admitted for altered mentation, and now improving, would like to avoid excessive pharmacotherapy at this time.  Patient was willing to try slightly increased dose of gabapentin for her pain in addition to melatonin for sleep. -1x Gabapentin 200 mg -Melatonin

## 2022-12-19 NOTE — TOC Transition Note (Signed)
Transition of Care [facility]) - CM/SW Discharge Note   Patient Details  Name: Ruth Watkins MRN: [id number] Date of Birth: 09-01-1951  [facility] of [facility]) CM/SW Contact:  Levonne Lapping, RN Phone Number: 12/19/2022, 3:52 PM   Clinical Narrative:     Patient will DC to home with Daughter. Home Health PT/OT and SW has been arranged with [facility] (patient choice as she had services a year ago)  [employer] has provided a bedside commode. All information entered onto AVS    No additional TOC needs           Patient Goals and CMS Choice      Discharge Placement                         Discharge Plan and Services Additional resources added to the After Visit Summary for                                       Social Determinants of Health (SDOH) Interventions SDOH Screenings   Tobacco Use: Low Risk  (12/17/2022)     Readmission Risk Interventions     No data to display

## 2022-12-19 NOTE — Discharge Summary (Signed)
Name: Ruth Watkins MRN: [id number] DOB: 1951-07-01 72 y.o. PCP: Pcp, No  Date of Admission: 12/17/2022  6:15 AM Date of Discharge: 12/19/2022 Attending Physician: Dr.  Johnnye Sima  Discharge Diagnosis: Principal Problem:   Encephalopathy acute Active Problems:   Pneumonia   Uncontrolled type 2 diabetes mellitus with hyperglycemia, with long-term current use of insulin   ESRD on hemodialysis   Anemia of renal disease   Hypertension    Discharge Medications: Allergies as of 12/19/2022   No Known Allergies      Medication List     STOP taking these medications    famotidine 20 MG tablet Commonly known as: PEPCID   losartan 100 MG tablet Commonly known as: COZAAR   pantoprazole 40 MG tablet Commonly known as: PROTONIX   traMADol 50 MG tablet Commonly known as: ULTRAM       TAKE these medications    allopurinol 100 MG tablet Commonly known as: ZYLOPRIM Take 100 mg by mouth daily.   amLODipine 5 MG tablet Commonly known as: NORVASC Take 5 mg by mouth daily.   ascorbic acid 500 MG tablet Commonly known as: VITAMIN C Take 500 mg by mouth daily.   aspirin EC 81 MG tablet Take 1 tablet (81 mg total) by mouth daily. Swallow whole. Start taking on: December 20, 2022   doxycycline 100 MG tablet Commonly known as: VIBRA-TABS Take 1 tablet (100 mg total) by mouth every 12 (twelve) hours for 2 days. Start taking on: December 20, 2022   FISH OIL BURP-LESS PO Take 1 capsule by mouth in the morning and at bedtime.   gabapentin 100 MG capsule Commonly known as: NEURONTIN Take 100 mg by mouth 2 (two) times daily.   insulin glargine 100 UNIT/ML injection Commonly known as: LANTUS Inject 20 Units into the skin daily.   rOPINIRole 2 MG tablet Commonly known as: REQUIP Take 2 mg by mouth at bedtime.   sevelamer 800 MG tablet Commonly known as: RENAGEL Take 800 mg by mouth 2 (two) times daily after a meal.               Durable Medical Equipment   (From admission, onward)           Start     Ordered   12/19/22 1224  For home use only DME Bedside commode  Once       Question:  Patient needs a bedside commode to treat with the following condition  Answer:  Acute encephalopathy   12/19/22 1225            Disposition and follow-up:   Ms.Ruth Watkins was discharged from [facility] in Good condition.  At the hospital follow up visit please address:  1.  Follow-up: -- Acute metabolic encephalopathy: treated PNA, continue glycemic control for T2DM, avoid high dose gabapentin and other centrally acting meds    -- LLL PNA: ensure completion of antibiotic; symptoms management   -- T2DM: resumed long-acting 20 units daily; may need further glycemic control given A1c 11.2  -- HTN: assess BP, consider restarting losartan per PCP f/u   2.  Labs / imaging needed at time of follow-up: none  3.  Pending labs/ test needing follow-up: 3/30 blood cultures (NGTD on 4/1)  4.  Medication Changes  -Doxycycline 100 mg twice a day for 2 days (start tomorrow 4/2)  -restart ASA 81 mg daily -continue lower dose of Gabapentin 100 mg BID   Follow-up Appointments:  Follow-up Information     [facility] Follow up.   Why: [facility] will provide Home Health PT/OT and SW   They will contact you to arrange initital home visit Contact information: [place] [address] [phone]         [facility] Follow up.   Why: [facility] is Museum/gallery conservator information: [id number]  [place]  [place]  [address]        [place] [place], [place]. Go on 01/05/2023.   Why: Call his office to schedule for earlier hospital follow-up appointment. Otherwise you already have appointent on: 01/05/2023  3:15 PM Contact information: [place]  [address] [phone]                Hospital Course by problem list: Ruth Watkins  is a 72 y.o. 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  Acute metabolic encephalopathy Patient was found by husband on the ground confused.  Code stroke was called, however CT head was negative and neurology evaluation suggested acute metabolic encephalopathy rather than a stroke.  Her vitals are stable and afebrile on presentation without leukocytosis.  Infectious workup revealed left lower lobe opacity on chest x-ray concerning for pneumonia. Procalcitonin was elevated at 0.6.  She was started on empiric treatment for CAP coverage. She was also markedly hyperglycemic at presentation which could have contributed to confusion, no signs of anion gap metabolic acidosis concerning for hyperglycemic emergency.  Home medications include gabapentin which could be contributing to presentation. Mentation improved and at time of discharge was A&Ox4. Advised about taking lower dose gabapentin and try tylenol for her other chronic pains which she is agreeable to.   Patient to discharge with home health PT, OT and social work. Received DME for BSC/3in1 prior to discharge.   Left lower lobe pneumonia Chest x-ray showed left lower lobe opacity.  Patient was afebrile with no leukocytosis.  She was started on CAP coverage with ceftriaxone and azithromycin. Procalcitonin elevated at 0.6. Ambulatory saturations were good on room air.  She was transition to p.o. antibiotics and will complete total 5 day course. Discharged with doxycycline and has f/u with her PCP at [facility].   Type 2 diabetes with hyperglycemia Hemoglobin A1c elevated 11.2 in January.  Home medications include glargine 20 units nightly and sliding scale with meals. Glucose was 500 on arrival without signs concerning for hyperglycemic emergency. Restarted home dose of long-acting insulin  20 units daily. CBGs improved. Patient to f/u with her PCP.   ESRD on HD (TThS) Received inpatient HD on 3/30. Patient to resume normal HD schedule of TThS with her outpatient dialysis center.   History of CVA  Was on DAPT but held in February in setting of acute GIB. No evidence of active bleeding. Hemoglobin stable so restarted home ASA 81 mg daily.    Hypertension BP mostly normotensive and at times mildly elevated. Restarted her home amlodipine 5 mg daily. Held losartan in setting of normotension initially, can f/u with PCP about resuming it.   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 Hgb has been stable during this admission. B12 and folate normal. Ferritin normal and mildly low iron. Likely in setting of  anemia of CKD.   Restless leg syndrome At home taking Requip 2 mg nightly and gabapentin possibly 600 mg TID although advised previously to decrease to 100 mg BID.  Held initially due to AMS but restarted Requip and lower dose gabapentin once mentation improved. No changes in mental status on lower dose gabapentin. Advised patient for feet and hand pain to try OTC tylenol rather than over taking gabapentin which she is agreeable to.   Discharge Subjective: Patient still has a cough with some mucus. Feels at times short of breath but thinks it is from the pneumonia. Her confusion has resolved. States she was on gabapentin 600 mg TID but was told to take 100 mg BID for her feet pain. Discussed other options for hand and feet pain such as OTC tylenol. Patient is agreeable to trying that.    Discharge Exam:   BP (!) 159/62 (BP Location: Right Arm)   Pulse 80   Temp 98.5 F (36.9 C) (Oral)   Resp 18   Ht 5\' 2"  (1.575 m)   Wt 70.8 kg   SpO2 100%   BMI 28.55 kg/m  Constitutional: awake, laying in bed comfortably, in no acute distress HENT: normocephalic atraumatic Neck: supple Cardiovascular:  regular rate and rhythm, no LE edema Pulmonary/Chest: normal work of breathing on room air, mild bibasilar crackles, no wheezing, no respiratory distress MSK: normal bulk and tone Neurological: alert & oriented x 3 Skin: warm and dry Psych: pleasant mood    Pertinent Labs, Studies, and Procedures:     Latest Ref Rng & Units 12/18/2022    4:07 AM 12/17/2022   10:15 AM 12/17/2022    6:21 AM  CBC  WBC 4.0 - 10.5 K/uL 6.2     Hemoglobin 12.0 - 15.0 g/dL 9.5  10.2  9.5   Hematocrit 36.0 - 46.0 % 30.5  30.0  28.0   Platelets 150 - 400 K/uL 234          Latest Ref Rng & Units 12/18/2022    4:07 AM 12/17/2022   10:15 AM 12/17/2022    6:21 AM  CMP  Glucose 70 - 99 mg/dL 148   530   BUN 8 - 23 mg/dL 10   17   Creatinine 0.44 - 1.00 mg/dL 2.16   3.30   Sodium 135 - 145 mmol/L 135  131  129   Potassium 3.5 - 5.1 mmol/L 3.9  3.8  3.4   Chloride 98 - 111 mmol/L 97   98   CO2 22 - 32 mmol/L 29     Calcium 8.9 - 10.3 mg/dL 7.9       DG Chest Portable 1 View  Result Date: 12/17/2022 CLINICAL DATA:  72 year old female with history of confusion and hallucination. Slurred speech. EXAM: PORTABLE CHEST 1 VIEW COMPARISON:  Chest x-ray 10/11/2022. FINDINGS: Lung volumes are low. Poorly defined opacity in the left lung base which may reflect atelectasis and/or consolidation, likely with superimposed small left pleural effusion. Interstitial prominence and peribronchial cuffing noted throughout the mid to lower lungs bilaterally (left-greater-than-right). No pneumothorax. No evidence of pulmonary edema. Heart size is normal. Upper mediastinal contours are within normal limits. IMPRESSION: 1. Findings are concerning for probable bronchitis with left lower lobe bronchopneumonia and small left parapneumonic pleural effusion, as above. Electronically Signed   By: Vinnie Langton M.D.   On: 12/17/2022 07:01   CT HEAD CODE STROKE WO CONTRAST  Result Date: 12/17/2022 CLINICAL DATA:  Code stroke. EXAM: CT HEAD  WITHOUT CONTRAST TECHNIQUE: Contiguous axial images were obtained from the base of the skull through the vertex without intravenous contrast. RADIATION DOSE REDUCTION: This exam was performed according to the departmental dose-optimization program which includes automated exposure control, adjustment of the mA and/or kV according to patient size and/or use of iterative reconstruction technique. COMPARISON:  01/12/2022 FINDINGS: Brain: No evidence of acute infarction, hemorrhage, hydrocephalus, extra-axial collection or mass lesion/mass effect. Vascular: No hyperdense vessel or unexpected calcification. Skull: Normal. Negative for fracture or focal lesion. Sinuses/Orbits: No acute finding. Other: These results were communicated to Dr. Lorrin Goodell at 6:31 am on 12/17/2022 by text page via the [facility] messaging system. ASPECTS [facility] Stroke Program Early CT Score) Not scored without specified symptoms IMPRESSION: Stable and negative head CT. Electronically Signed   By: Jorje Guild M.D.   On: 12/17/2022 06:32     Discharge Instructions: Discharge Instructions     Call MD for:  difficulty breathing, headache or visual disturbances   Complete by: As directed    Call MD for:  extreme fatigue   Complete by: As directed    Call MD for:  hives   Complete by: As directed    Call MD for:  persistant dizziness or light-headedness   Complete by: As directed    Call MD for:  persistant nausea and vomiting   Complete by: As directed    Call MD for:  redness, tenderness, or signs of infection (pain, swelling, redness, odor or green/yellow discharge around incision site)   Complete by: As directed    Call MD for:  severe uncontrolled pain   Complete by: As directed    Call MD for:  temperature >100.4   Complete by: As directed    Diet - low sodium heart healthy   Complete by: As directed    Discharge instructions   Complete by: As directed    You were hospitalized for recent confusion at home. We are  treating your pneumonia with antibiotics. You will continue taking Doxycycline twice a day for 2 days starting tomorrow 12/20/22. I am glad you are improved and feeling better. You may continue your gabapentin at the lower dose of 100 mg twice a day. Sometimes taking too much of it can lead to confusion and drowsiness. You can try over-the-counter tylenol 1000 mg every 8 hours as needed for your chronic hand and feet pain. You will continue outpatient dialysis with your dialysis center. Thank you for allowing [place] to be part of your care.    Your upcoming follow up appointments: Mort Sawyers, DNP. Go on 01/05/2023.  Why: Call his office to schedule for earlier hospital follow-up appointment. Otherwise you already have appointent on: 01/05/2023  3:15 PM Contact information: [place] [address] [phone]   Please note these changes made to your medications:  *Please START taking:  -Doxycycline 100 mg twice a day for 2 days (start tomorrow 4/2) -restart Aspirin 81 mg daily  -You can try over-the-counter acetaminophen (Tylenol) to help with your hand and feet pain.    Please make sure to follow up with your primary care provider Hardin Negus, Maryfrances Bunnell, DNP) along with your specialists after your recent hospitalization.   Increase activity slowly   Complete by: As directed        Signed: Angelique Blonder, DO 12/19/2022, 3:39 PM   Pager: [phone]

## 2022-12-19 NOTE — Progress Notes (Addendum)
Contacted by attending regarding plans for pt to d/c to home today. [place] and spoke to [place]. Clinic advised of pt's d/c today and that pt should resume care tomorrow. Pt's schedule is TTS 2nd shift. Will fax d/c summary to clinic once completed for continuation of care. Update provided to nephrologists and renal PA as well.   Melven Sartorius Renal Navigator [phone]  Addendum at 4:49 pm: D/C summary and renal note from 3/31 faxed to clinic for continuation of care.

## 2022-12-19 NOTE — Plan of Care (Signed)
  Problem: Metabolic: Goal: Ability to maintain appropriate glucose levels will improve Outcome: Not Progressing   

## 2022-12-19 NOTE — Progress Notes (Signed)
Occupational Therapy Treatment Patient Details Name: Ruth Watkins MRN: [id number] DOB: 1951/04/16 Today's Date: 12/19/2022   History of present illness Pt is a 72 y.o. female presenting after fall at home. Family reports AMS, hallucinations, and slurred speech after assisting her up from floor. CT negative. Admitted for acute metabolic encephalopathy. CXR concerning for pneumonia. Hyperglycemic on presentation. PMH significant for ESRD on dialysis, DMII poorly controlled, cerebrovascular disease, CAD.   OT comments  Pt ambulating within her room/bathroom modified independently, touching furniture intermittently. Completed toileting and standing grooming modified independently and UB dressing with set up. Pt c/o shortness of breath, SpO2 92% at rest in bed, 97% after walking. Continue to recommend home based OT to address IADLs, memory deficit compensation and showering.   Recommendations for follow up therapy are one component of a multi-disciplinary discharge planning process, led by the attending physician.  Recommendations may be updated based on patient status, additional functional criteria and insurance authorization.    Assistance Recommended at Discharge Intermittent Supervision/Assistance  Patient can return home with the following  Direct supervision/assist for medications management;Direct supervision/assist for financial management;Assist for transportation   Equipment Recommendations  BSC/3in1    Recommendations for Other Services      Precautions / Restrictions Precautions Precautions: Fall Restrictions Weight Bearing Restrictions: No       Mobility Bed Mobility Overal bed mobility: Modified Independent                  Transfers Overall transfer level: Modified independent Equipment used: None               General transfer comment: reaches for furniture/door with ambulation to bathroom, steadier once up and returning to chair      Balance Overall balance assessment: Mild deficits observed, not formally tested                                         ADL either performed or assessed with clinical judgement   ADL       Grooming: Modified independent;Standing;Wash/dry hands           Upper Body Dressing : Set up;Sitting       Toilet Transfer: Modified Independent;Ambulation;Regular Toilet   Toileting- Clothing Manipulation and Hygiene: Modified independent;Sit to/from stand       Functional mobility during ADLs: Modified independent (bed to and from bathroom)      Extremity/Trunk Assessment              Vision       Perception     Praxis      Cognition Arousal/Alertness: Awake/alert Behavior During Therapy: WFL for tasks assessed/performed                         Memory: Decreased short-term memory         General Comments: pt with difficulty recalling information from MDs and med changes        Exercises      Shoulder Instructions       General Comments      Pertinent Vitals/ Pain       Pain Assessment Pain Assessment: Faces Faces Pain Scale: Hurts a little bit Pain Location: feet Pain Descriptors / Indicators: Grimacing, Discomfort Pain Intervention(s): Monitored during session, Repositioned  Home Living  Prior Functioning/Environment              Frequency  Min 2X/week        Progress Toward Goals  OT Goals(current goals can now be found in the care plan section)  Progress towards OT goals: Progressing toward goals  Acute Rehab OT Goals OT Goal Formulation: With patient Time For Goal Achievement: 01/01/23 Potential to Achieve Goals: Good  Plan Discharge plan remains appropriate    Co-evaluation                 AM-PAC OT "6 Clicks" Daily Activity     Outcome Measure   Help from another person eating meals?: None Help from another person taking  care of personal grooming?: None Help from another person toileting, which includes using toliet, bedpan, or urinal?: None Help from another person bathing (including washing, rinsing, drying)?: A Little Help from another person to put on and taking off regular upper body clothing?: None Help from another person to put on and taking off regular lower body clothing?: None 6 Click Score: 23    End of Session    OT Visit Diagnosis: Unsteadiness on feet (R26.81);Other symptoms and signs involving cognitive function   Activity Tolerance Patient tolerated treatment well   Patient Left in chair;with call bell/phone within reach;with nursing/sitter in room   Nurse Communication          Time: 0912-0929 OT Time Calculation (min): 17 min  Charges: OT General Charges $OT Visit: 1 Visit OT Treatments $Self Care/Home Management : 8-22 mins  Cleta Alberts, OTR/L Acute Rehabilitation Services Office: [phone]   Malka So 12/19/2022, 9:34 AM

## 2022-12-19 NOTE — Discharge Instructions (Addendum)
You were hospitalized for recent confusion at home. We are treating your pneumonia with antibiotics. You will continue taking Doxycycline twice a day for 2 days starting tomorrow 12/20/22. I am glad you are improved and feeling better. You may continue your gabapentin at the lower dose of 100 mg twice a day. Sometimes taking too much of it can lead to confusion and drowsiness. You can try over-the-counter tylenol 1000 mg every 8 hours as needed for your chronic hand and feet pain. You will continue outpatient dialysis with your dialysis center. Thank you for allowing [place] to be part of your care.   Your upcoming follow up appointments: Mort Sawyers, DNP. Go on 01/05/2023.   Why: Call his office to schedule for earlier hospital follow-up appointment. Otherwise you already have appointent on: 01/05/2023  3:15 PM Contact information: [place] [address] [phone]  Please note these changes made to your medications:  *Please START taking:  -Doxycycline 100 mg twice a day for 2 days (start tomorrow 4/2) -restart Aspirin 81 mg daily  -You can try over-the-counter acetaminophen (Tylenol) to help with your hand and feet pain.   Please make sure to follow up with your primary care provider Hardin Negus, Maryfrances Bunnell, DNP) along with your specialists after your recent hospitalization.

## 2022-12-19 NOTE — Progress Notes (Signed)
Physical Therapy Treatment Patient Details Name: Ruth Watkins MRN: [id number] DOB: 06-Sep-1951 Today's Date: 12/19/2022   History of Present Illness Pt is a 72 y.o. female presenting after fall at home. Family reports AMS, hallucinations, and slurred speech after assisting her up from floor. CT negative. Admitted for acute metabolic encephalopathy. CXR concerning for pneumonia. Hyperglycemic on presentation. PMH significant for ESRD on dialysis, DMII poorly controlled, cerebrovascular disease, CAD.    PT Comments    Pt progressing towards physical therapy goals. No assist required throughout session however supervision provided for safety when pt not utilizing the rollator. Pt appears to prefer furniture walking around room instead of rollator. Although pt mod I with transfers and able to perform hygiene without assistance after BM, pt tearful at times, stating she's not sure she is ready to go home. Provided encouragement, and reinforced safety recommendations. Will continue to follow.     Recommendations for follow up therapy are one component of a multi-disciplinary discharge planning process, led by the attending physician.  Recommendations may be updated based on patient status, additional functional criteria and insurance authorization.  Follow Up Recommendations       Assistance Recommended at Discharge Intermittent Supervision/Assistance  Patient can return home with the following A little help with walking and/or transfers;Assistance with cooking/housework;Assist for transportation;Help with stairs or ramp for entrance   Equipment Recommendations  BSC/3in1    Recommendations for Other Services       Precautions / Restrictions Precautions Precautions: Fall Precaution Comments: diarrhea Restrictions Weight Bearing Restrictions: No     Mobility  Bed Mobility Overal bed mobility: Modified Independent Bed Mobility: Supine to Sit                 Transfers Overall transfer level: Modified independent Equipment used: None, Rollator (4 wheels) Transfers: Sit to/from Stand, Bed to chair/wheelchair/BSC             General transfer comment: Rollator when standing from the bed and no AD when standing from the toilet.    Ambulation/Gait Ambulation/Gait assistance: Supervision Gait Distance (Feet): 25 Feet Assistive device: Rollator (4 wheels), None Gait Pattern/deviations: Step-through pattern, Decreased stride length, Trunk flexed Gait velocity: Decreased Gait velocity interpretation: <1.31 ft/sec, indicative of household ambulator   General Gait Details: Around room only due to diarrhea and pt fatigue. Pt initially utilized rollator to bathroom and then furniture walked from bathroom back to recliner for lunch. No overt LOB noted and no assist required.   Stairs             Wheelchair Mobility    Modified Rankin (Stroke Patients Only)       Balance Overall balance assessment: Mild deficits observed, not formally tested                                          Cognition Arousal/Alertness: Awake/alert Behavior During Therapy: WFL for tasks assessed/performed Overall Cognitive Status: Impaired/Different from baseline Area of Impairment: Memory                     Memory: Decreased short-term memory         General Comments: pt with difficulty recalling information from MDs and med changes        Exercises      General Comments        Pertinent Vitals/Pain Pain Assessment Pain Assessment: Faces  Faces Pain Scale: Hurts a little bit Pain Location: feet Pain Descriptors / Indicators: Grimacing, Discomfort Pain Intervention(s): Limited activity within patient's tolerance, Monitored during session, Repositioned    Home Living Family/patient expects to be discharged to:: Private residence Living Arrangements: Spouse/significant other Available Help at Discharge:  Family;Available PRN/intermittently (daughter lives behind them but works; spouse unable to assist) Type of Home: Mobile home Home Access: Ramped entrance       Home Layout: One level Home Equipment: Rollator (4 wheels)      Prior Function            PT Goals (current goals can now be found in the care plan section) Acute Rehab PT Goals Patient Stated Goal: to go home PT Goal Formulation: With patient Time For Goal Achievement: 01/01/23 Potential to Achieve Goals: Good Progress towards PT goals: Progressing toward goals    Frequency    Min 3X/week      PT Plan Current plan remains appropriate    Co-evaluation              AM-PAC PT "6 Clicks" Mobility   Outcome Measure  Help needed turning from your back to your side while in a flat bed without using bedrails?: None Help needed moving from lying on your back to sitting on the side of a flat bed without using bedrails?: None Help needed moving to and from a bed to a chair (including a wheelchair)?: A Little Help needed standing up from a chair using your arms (e.g., wheelchair or bedside chair)?: None Help needed to walk in hospital room?: A Little Help needed climbing 3-5 steps with a railing? : A Little 6 Click Score: 21    End of Session Equipment Utilized During Treatment: Gait belt Activity Tolerance: Patient limited by fatigue Patient left: in chair;with call bell/phone within reach;with chair alarm set Nurse Communication: Mobility status PT Visit Diagnosis: Muscle weakness (generalized) (M62.81)     Time: [id number] PT Time Calculation (min) (ACUTE ONLY): 25 min  Charges:  $Gait Training: 8-22 mins $Therapeutic Activity: 8-22 mins                     [name], PT, DPT Acute Rehabilitation Services Secure Chat Preferred Office: [phone]    Thelma Comp 12/19/2022, 12:49 PM

## 2022-12-20 LAB — HEPATITIS B SURFACE ANTIBODY, QUANTITATIVE: Hep B S AB Quant (Post): 6.9 m[IU]/mL — ABNORMAL LOW (ref >9.9)

## 2022-12-22 LAB — CULTURE, BLOOD (ROUTINE X 2)
Culture: NO GROWTH
Culture: NO GROWTH
Special Requests: ADEQUATE

## 2023-02-24 NOTE — ED Provider Notes (Signed)
[facility] Provider Note   CSN: [id number] Arrival date & time: 12/17/22  4098     History  Chief Complaint  Patient presents with   Code Stroke    Ruth Watkins is a 72 y.o. female.  Patient with multiple past medical problems presents ER today with a concern for code stroke.  She was last known well around 2200.  At 0300 she woke up and was confused with slurred speech and somewhat hallucinating.  Picking at things in the air that were not there.  Seeing things and making sense.  EMS was called brought here as a code stroke.  She is on dialysis has not missed any sessions recently.  Is not very consistently compliant with her medications.        Home Medications Prior to Admission medications   Medication Sig Start Date End Date Taking? Authorizing Provider  allopurinol (ZYLOPRIM) 100 MG tablet Take 100 mg by mouth daily.   Yes [provider]  amLODipine (NORVASC) 5 MG tablet Take 5 mg by mouth daily.   Yes [provider]  ascorbic acid (VITAMIN C) 500 MG tablet Take 500 mg by mouth daily.   Yes [provider]  gabapentin (NEURONTIN) 100 MG capsule Take 100 mg by mouth 2 (two) times daily.   Yes [provider]  insulin glargine (LANTUS) 100 UNIT/ML injection Inject 20 Units into the skin daily.   Yes [provider]  Omega-3 Fatty Acids (FISH OIL BURP-LESS PO) Take 1 capsule by mouth in the morning and at bedtime.   Yes [provider]  rOPINIRole (REQUIP) 2 MG tablet Take 2 mg by mouth at bedtime.   Yes [provider]  sevelamer (RENAGEL) 800 MG tablet Take 800 mg by mouth 2 (two) times daily after a meal.   Yes [provider]  aspirin EC 81 MG tablet Take 1 tablet (81 mg total) by mouth daily. Swallow whole. 12/20/22   Rana Snare, DO      Allergies    Patient has no known allergies.    Review of Systems   Review of Systems  Physical Exam Updated Vital  Signs BP (!) 159/62 (BP Location: Right Arm)   Pulse 80   Temp 98.5 F (36.9 C) (Oral)   Resp 18   Ht 5\' 2"  (1.575 m)   Wt 70.8 kg   SpO2 100%   BMI 28.55 kg/m  Physical Exam Vitals and nursing note reviewed.  Constitutional:      Appearance: She is well-developed.  HENT:     Head: Normocephalic and atraumatic.  Cardiovascular:     Rate and Rhythm: Normal rate and regular rhythm.  Pulmonary:     Effort: No respiratory distress.     Breath sounds: No stridor.  Abdominal:     General: There is no distension.  Musculoskeletal:     Cervical back: Normal range of motion.  Neurological:     Mental Status: She is alert. She is disoriented.     ED Results / Procedures / Treatments   Labs (all labs ordered are listed, but only abnormal results are displayed) Labs Reviewed  PROTIME-INR - Abnormal; Notable for the following components:      Result Value   Prothrombin Time 15.4 (*)    All other components within normal limits  CBC - Abnormal; Notable for the following components:   RBC 3.25 (*)    Hemoglobin 9.1 (*)    HCT  29.8 (*)    All other components within normal limits  COMPREHENSIVE METABOLIC PANEL - Abnormal; Notable for the following components:   Sodium 132 (*)    Potassium 3.4 (*)    Chloride 92 (*)    Glucose, Bld 509 (*)    Creatinine, Ser 3.01 (*)    Calcium 8.0 (*)    Total Protein 5.3 (*)    Albumin 2.9 (*)    AST 13 (*)    GFR, Estimated 16 (*)    Anion gap 16 (*)    All other components within normal limits  HEPATITIS B SURFACE ANTIBODY, QUANTITATIVE - Abnormal; Notable for the following components:   Hep B S AB Quant (Post) 6.9 (*)    All other components within normal limits  PHOSPHORUS - Abnormal; Notable for the following components:   Phosphorus 5.3 (*)    All other components within normal limits  BRAIN NATRIURETIC PEPTIDE - Abnormal; Notable for the following components:   B Natriuretic Peptide 2,006.0 (*)    All other components within  normal limits  BLOOD GAS, VENOUS - Abnormal; Notable for the following components:   Bicarbonate 29.8 (*)    Acid-Base Excess 2.9 (*)    All other components within normal limits  GLUCOSE, CAPILLARY - Abnormal; Notable for the following components:   Glucose-Capillary 395 (*)    All other components within normal limits  BASIC METABOLIC PANEL - Abnormal; Notable for the following components:   Chloride 97 (*)    Glucose, Bld 148 (*)    Creatinine, Ser 2.16 (*)    Calcium 7.9 (*)    GFR, Estimated 24 (*)    All other components within normal limits  CBC - Abnormal; Notable for the following components:   RBC 3.33 (*)    Hemoglobin 9.5 (*)    HCT 30.5 (*)    RDW 15.8 (*)    All other components within normal limits  IRON AND TIBC - Abnormal; Notable for the following components:   Iron 27 (*)    TIBC 192 (*)    All other components within normal limits  GLUCOSE, CAPILLARY - Abnormal; Notable for the following components:   Glucose-Capillary 133 (*)    All other components within normal limits  GLUCOSE, CAPILLARY - Abnormal; Notable for the following components:   Glucose-Capillary 139 (*)    All other components within normal limits  GLUCOSE, CAPILLARY - Abnormal; Notable for the following components:   Glucose-Capillary 226 (*)    All other components within normal limits  GLUCOSE, CAPILLARY - Abnormal; Notable for the following components:   Glucose-Capillary 127 (*)    All other components within normal limits  GLUCOSE, CAPILLARY - Abnormal; Notable for the following components:   Glucose-Capillary 133 (*)    All other components within normal limits  GLUCOSE, CAPILLARY - Abnormal; Notable for the following components:   Glucose-Capillary 242 (*)    All other components within normal limits  GLUCOSE, CAPILLARY - Abnormal; Notable for the following components:   Glucose-Capillary 234 (*)    All other components within normal limits  GLUCOSE, CAPILLARY - Abnormal; Notable  for the following components:   Glucose-Capillary 288 (*)    All other components within normal limits  URINALYSIS, ROUTINE W REFLEX MICROSCOPIC - Abnormal; Notable for the following components:   APPearance HAZY (*)    Glucose, UA >=500 (*)    Protein, ur >=300 (*)    Leukocytes,Ua TRACE (*)    Bacteria, UA  RARE (*)    All other components within normal limits  GLUCOSE, CAPILLARY - Abnormal; Notable for the following components:   Glucose-Capillary 199 (*)    All other components within normal limits  GLUCOSE, CAPILLARY - Abnormal; Notable for the following components:   Glucose-Capillary 348 (*)    All other components within normal limits  GLUCOSE, CAPILLARY - Abnormal; Notable for the following components:   Glucose-Capillary 329 (*)    All other components within normal limits  CBG MONITORING, ED - Abnormal; Notable for the following components:   Glucose-Capillary 521 (*)    All other components within normal limits  I-STAT CHEM 8, ED - Abnormal; Notable for the following components:   Sodium 129 (*)    Potassium 3.4 (*)    Creatinine, Ser 3.30 (*)    Glucose, Bld 530 (*)    Calcium, Ion 0.74 (*)    Hemoglobin 9.5 (*)    HCT 28.0 (*)    All other components within normal limits  CBG MONITORING, ED - Abnormal; Notable for the following components:   Glucose-Capillary 409 (*)    All other components within normal limits  I-STAT VENOUS BLOOD GAS, ED - Abnormal; Notable for the following components:   pH, Ven 7.452 (*)    pCO2, Ven 33.9 (*)    pO2, Ven 50 (*)    Sodium 131 (*)    Calcium, Ion 0.94 (*)    HCT 30.0 (*)    Hemoglobin 10.2 (*)    All other components within normal limits  CULTURE, BLOOD (ROUTINE X 2)  CULTURE, BLOOD (ROUTINE X 2)  RESP PANEL BY RT-PCR (RSV, FLU A&B, COVID)  RVPGX2  ETHANOL  APTT  DIFFERENTIAL  AMMONIA  HEPATITIS B SURFACE ANTIGEN  MAGNESIUM  PROCALCITONIN  FERRITIN  VITAMIN B12  FOLATE  RAPID URINE DRUG SCREEN, HOSP PERFORMED     EKG EKG Interpretation  Date/Time:  Saturday December 17 2022 06:37:24 EDT Ventricular Rate:  68 PR Interval:  178 QRS Duration: 96 QT Interval:  480 QTC Calculation: 511 R Axis:   -19 Text Interpretation: Sinus rhythm Borderline left axis deviation Confirmed by Lockie Mola, Adam ([id number]) on 12/17/2022 7:07:34 AM  Radiology No results found.  Procedures .Critical Care  Performed by: Marily Memos, MD Authorized by: Marily Memos, MD   Critical care provider statement:    Critical care time (minutes):  30   Critical care was time spent personally by me on the following activities:  Development of treatment plan with patient or surrogate, discussions with consultants, evaluation of patient's response to treatment, examination of patient, ordering and review of laboratory studies, ordering and review of radiographic studies, ordering and performing treatments and interventions, pulse oximetry, re-evaluation of patient's condition and review of old charts     Medications Ordered in ED Medications  lactated ringers bolus 1,000 mL (0 mLs Intravenous Stopped 12/17/22 1018)  potassium chloride 10 mEq in 100 mL IVPB (0 mEq Intravenous Stopped 12/17/22 1105)  cefTRIAXone (ROCEPHIN) 2 g in sodium chloride 0.9 % 100 mL IVPB (0 g Intravenous Stopped 12/17/22 0829)  azithromycin (ZITHROMAX) 500 mg in sodium chloride 0.9 % 250 mL IVPB (0 mg Intravenous Stopped 12/17/22 0932)  azithromycin (ZITHROMAX) tablet 500 mg (500 mg Oral Given 12/19/22 0857)  gabapentin (NEURONTIN) capsule 200 mg (200 mg Oral Given 12/19/22 0055)    ED Course/ Medical Decision Making/ A&P  Medical Decision Making Amount and/or Complexity of Data Reviewed Labs: ordered. Radiology: ordered.  Risk Prescription drug management. Decision regarding hospitalization.   Patient was some changes but seemed more consistent with a metabolic or infectious pathology rather than primary neurology.  No tPA  secondary to being outside the window and not a clear candidate based on symptomatology.  X-ray did show likely pneumonia.  Antibiotics started.  Still confused and not acting herself so discussed with resident team for admission.  Final Clinical Impression(s) / ED Diagnoses Final diagnoses:  Hyperglycemia  Encephalomyopathy    Rx / DC Orders ED Discharge Orders          Ordered    aspirin EC 81 MG tablet  Daily        12/19/22 1525    doxycycline (VIBRA-TABS) 100 MG tablet  Every 12 hours        12/19/22 1525    Increase activity slowly        12/19/22 1538    Diet - low sodium heart healthy        12/19/22 1538    Call MD for:  temperature >100.4        12/19/22 1538    Call MD for:  persistant nausea and vomiting        12/19/22 1538    Call MD for:  severe uncontrolled pain        12/19/22 1538    Call MD for:  redness, tenderness, or signs of infection (pain, swelling, redness, odor or green/yellow discharge around incision site)        12/19/22 1538    Call MD for:  difficulty breathing, headache or visual disturbances        12/19/22 1538    Call MD for:  hives        12/19/22 1538    Call MD for:  persistant dizziness or light-headedness        12/19/22 1538    Call MD for:  extreme fatigue        12/19/22 1538    Discharge instructions       Comments: You were hospitalized for recent confusion at home. We are treating your pneumonia with antibiotics. You will continue taking Doxycycline twice a day for 2 days starting tomorrow 12/20/22. I am glad you are improved and feeling better. You may continue your gabapentin at the lower dose of 100 mg twice a day. Sometimes taking too much of it can lead to confusion and drowsiness. You can try over-the-counter tylenol 1000 mg every 8 hours as needed for your chronic hand and feet pain. You will continue outpatient dialysis with your dialysis center. Thank you for allowing [place] to be part of your care.    Your upcoming follow up  appointments: Ambrose Finland, DNP. Go on 01/05/2023.  Why: Call his office to schedule for earlier hospital follow-up appointment. Otherwise you already have appointent on: 01/05/2023  3:15 PM Contact information: [address] [place] [address] [phone]   Please note these changes made to your medications:  *Please START taking:  -Doxycycline 100 mg twice a day for 2 days (start tomorrow 4/2) -restart Aspirin 81 mg daily  -You can try over-the-counter acetaminophen (Tylenol) to help with your hand and feet pain.    Please make sure to follow up with your primary care provider [facility], Lawrence Santiago, DNP) along with your specialists after your recent hospitalization.   12/19/22 1538  Rozella Servello, Barbara Cower, MD 02/24/23 [phone]

## 2024-07-20 DEATH — deceased
